# Patient Record
Sex: Female | Born: 1955 | Race: White | Hispanic: No | Marital: Single | Smoking: Never smoker
Health system: Southern US, Community
[De-identification: ages and names within clinical notes are randomized; demographics above are authoritative.]

## PROBLEM LIST (undated history)

## (undated) DIAGNOSIS — C801 Malignant (primary) neoplasm, unspecified: Secondary | ICD-10-CM

## (undated) DIAGNOSIS — H269 Unspecified cataract: Secondary | ICD-10-CM

## (undated) DIAGNOSIS — M1990 Unspecified osteoarthritis, unspecified site: Secondary | ICD-10-CM

## (undated) DIAGNOSIS — T7840XA Allergy, unspecified, initial encounter: Secondary | ICD-10-CM

## (undated) DIAGNOSIS — I10 Essential (primary) hypertension: Secondary | ICD-10-CM

## (undated) DIAGNOSIS — Z9109 Other allergy status, other than to drugs and biological substances: Secondary | ICD-10-CM

## (undated) DIAGNOSIS — F32A Depression, unspecified: Secondary | ICD-10-CM

## (undated) DIAGNOSIS — Z923 Personal history of irradiation: Secondary | ICD-10-CM

## (undated) DIAGNOSIS — K219 Gastro-esophageal reflux disease without esophagitis: Secondary | ICD-10-CM

## (undated) HISTORY — PX: OTHER SURGICAL HISTORY: SHX169

## (undated) HISTORY — DX: Malignant (primary) neoplasm, unspecified: C80.1

## (undated) HISTORY — PX: EYE SURGERY: SHX253

## (undated) HISTORY — DX: Unspecified cataract: H26.9

## (undated) HISTORY — DX: Unspecified osteoarthritis, unspecified site: M19.90

## (undated) HISTORY — DX: Gastro-esophageal reflux disease without esophagitis: K21.9

## (undated) HISTORY — DX: Allergy, unspecified, initial encounter: T78.40XA

## (undated) HISTORY — PX: BACK SURGERY: SHX140

## (undated) HISTORY — PX: ABDOMINAL HYSTERECTOMY: SHX81

## (undated) HISTORY — DX: Essential (primary) hypertension: I10

## (undated) HISTORY — PX: MANDIBLE FRACTURE SURGERY: SHX706

## (undated) HISTORY — DX: Depression, unspecified: F32.A

## (undated) HISTORY — PX: FRACTURE SURGERY: SHX138

---

## 2001-01-03 ENCOUNTER — Emergency Department (HOSPITAL_COMMUNITY): Admission: EM | Admit: 2001-01-03 | Discharge: 2001-01-03 | Payer: Self-pay | Admitting: Emergency Medicine

## 2001-01-03 ENCOUNTER — Encounter: Payer: Self-pay | Admitting: Emergency Medicine

## 2004-06-19 ENCOUNTER — Emergency Department (HOSPITAL_COMMUNITY): Admission: EM | Admit: 2004-06-19 | Discharge: 2004-06-19 | Payer: Self-pay | Admitting: Emergency Medicine

## 2005-11-12 ENCOUNTER — Emergency Department (HOSPITAL_COMMUNITY): Admission: EM | Admit: 2005-11-12 | Discharge: 2005-11-12 | Payer: Self-pay | Admitting: Emergency Medicine
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
## 2012-08-04 ENCOUNTER — Emergency Department (HOSPITAL_COMMUNITY)
Admission: EM | Admit: 2012-08-04 | Discharge: 2012-08-04 | Disposition: A | Discharge status: Home / Self Care | Payer: 59 | Attending: Emergency Medicine | Admitting: Emergency Medicine

## 2012-08-04 ENCOUNTER — Encounter (HOSPITAL_COMMUNITY): Payer: Self-pay | Admitting: Emergency Medicine

## 2012-08-04 DIAGNOSIS — I10 Essential (primary) hypertension: Secondary | ICD-10-CM

## 2012-08-04 DIAGNOSIS — R42 Dizziness and giddiness: Secondary | ICD-10-CM | POA: Insufficient documentation

## 2012-08-04 DIAGNOSIS — R040 Epistaxis: Secondary | ICD-10-CM

## 2012-08-04 HISTORY — DX: Other allergy status, other than to drugs and biological substances: Z91.09

## 2012-08-04 LAB — CBC WITH DIFFERENTIAL/PLATELET
Eosinophils Absolute: 0.1 10*3/uL (ref 0.0–0.7)
Eosinophils Relative: 2 % (ref 0–5)
Hemoglobin: 14 g/dL (ref 12.0–15.0)
Lymphocytes Relative: 18 % (ref 12–46)
MCH: 28.8 pg (ref 26.0–34.0)
MCHC: 35 g/dL (ref 30.0–36.0)
Monocytes Absolute: 0.5 10*3/uL (ref 0.1–1.0)
Monocytes Relative: 7 % (ref 3–12)
Neutro Abs: 4.8 10*3/uL (ref 1.7–7.7)
Neutrophils Relative %: 72 % (ref 43–77)

## 2012-08-04 LAB — BASIC METABOLIC PANEL
BUN: 25 mg/dL — ABNORMAL HIGH (ref 6–23)
Creatinine, Ser: 0.79 mg/dL (ref 0.50–1.10)
GFR calc Af Amer: >90 mL/min (ref >90)
Potassium: 3.5 mEq/L (ref 3.5–5.1)
Sodium: 140 mEq/L (ref 135–145)

## 2012-08-04 MED ORDER — SODIUM CHLORIDE 0.9 % IV BOLUS (SEPSIS)
500.0000 mL | Freq: Once | INTRAVENOUS | Status: AC
  Administered 2012-08-04: 500 mL via INTRAVENOUS

## 2012-08-04 MED ORDER — OXYMETAZOLINE HCL 0.05 % NA SOLN
1.0000 | Freq: Once | NASAL | Status: AC
  Administered 2012-08-04: 1 via NASAL
  Filled 2012-08-04: qty 15

## 2012-08-04 NOTE — ED Notes (Signed)
Pt given discharge paperwork.  No additional questions by pt regarding d/c.  VSS. Resps e/u.  Ambulatory on d/c.  E-signature obtained.

## 2012-08-04 NOTE — ED Notes (Signed)
Pt brought over to CDU 6 from FT approx 1230, this RN aware of pt's arrival unable to see pt or receive report upon arrival to ED

## 2012-08-04 NOTE — ED Notes (Signed)
Pt has had 3 nosebleeds over past 2 days. Does not take blood thinners. BP elevated 196/108. States her PCP told her she was "pre-hypertensive" three years ago. Has no PCP now.

## 2012-08-04 NOTE — ED Provider Notes (Signed)
History     CSN: [id number]  Arrival date & time 08/04/12  1155   First MD Initiated Contact with Patient 08/04/12 1219      Chief Complaint  Patient presents with  . Hypertension    (Consider location/radiation/quality/duration/timing/severity/associated sxs/prior treatment) HPI  Nosebleed x 2 left nares past two days.  No trauma, patient with seasonal allergies.No history of same.  PMD none.  Patient feels slightly weak but gets really lightheaded when she sees blood.  Denies chest pain, sob, no vomiting.  Some nausea after swallowing blood.  History of prolonged bleeding after surgeries.  Patient takes one aspirin 325 per day.    Past Medical History  Diagnosis Date  . Environmental allergies     Past Surgical History  Procedure Laterality Date  .  right arm surgery Right   . Mandible fracture surgery    . Back surgery      No family history on file.  History  Substance Use Topics  . Smoking status: Never Smoker   . Smokeless tobacco: Not on file  . Alcohol Use: No    OB History   Grav Para Term Preterm Abortions TAB SAB Ect Mult Living                  Review of Systems  All other systems reviewed and are negative.    Allergies  Codeine  Home Medications  No current outpatient prescriptions on file. Allegra otc BP 196/96  Pulse 96  Temp(Src) 97.8 F (36.6 C) (Oral)  Resp 18  SpO2 99%  Physical Exam  Nursing note and vitals reviewed. Constitutional: She is oriented to person, place, and time. She appears well-developed and well-nourished.  HENT:  Head: Normocephalic and atraumatic.  Right Ear: External ear normal.  Left Ear: External ear normal.  Mouth/Throat: Oropharynx is clear and moist.  Dried blood bilateral nares.    Eyes: Conjunctivae and EOM are normal. Pupils are equal, round, and reactive to light.  Neck: Normal range of motion. Neck supple.  Cardiovascular: Normal rate, regular rhythm, normal heart sounds and intact distal  pulses.   Pulmonary/Chest: Effort normal and breath sounds normal.  Abdominal: Soft. Bowel sounds are normal.  Musculoskeletal: Normal range of motion. She exhibits no edema.  Neurological: She is alert and oriented to person, place, and time. She has normal reflexes.  Skin: Skin is warm and dry. No rash noted. No erythema. No pallor.  Psychiatric: She has a normal mood and affect. Her behavior is normal. Judgment and thought content normal.    ED Course  Procedures (including critical care time)  Labs Reviewed - No data to display No results found.   No diagnosis found.   Results for orders placed during the hospital encounter of 08/04/12  CBC WITH DIFFERENTIAL      Result Value Range   WBC 6.6  4.0 - 10.5 K/uL   RBC 4.86  3.87 - 5.11 MIL/uL   Hemoglobin 14.0  12.0 - 15.0 g/dL   HCT 78.2  95.6 - 21.3 %   MCV 82.3  78.0 - 100.0 fL   MCH 28.8  26.0 - 34.0 pg   MCHC 35.0  30.0 - 36.0 g/dL   RDW 08.6  57.8 - 46.9 %   Platelets 257  150 - 400 K/uL   Neutrophils Relative 72  43 - 77 %   Neutro Abs 4.8  1.7 - 7.7 K/uL   Lymphocytes Relative 18  12 - 46 %  Lymphs Abs 1.2  0.7 - 4.0 K/uL   Monocytes Relative 7  3 - 12 %   Monocytes Absolute 0.5  0.1 - 1.0 K/uL   Eosinophils Relative 2  0 - 5 %   Eosinophils Absolute 0.1  0.0 - 0.7 K/uL   Basophils Relative 0  0 - 1 %   Basophils Absolute 0.0  0.0 - 0.1 K/uL  BASIC METABOLIC PANEL      Result Value Range   Sodium 140  135 - 145 mEq/L   Potassium 3.5  3.5 - 5.1 mEq/L   Chloride 105  96 - 112 mEq/L   CO2 25  19 - 32 mEq/L   Glucose, Bld 87  70 - 99 mg/dL   BUN 25 (*) 6 - 23 mg/dL   Creatinine, Ser 1.61  0.50 - 1.10 mg/dL   Calcium 9.8  8.4 - 09.6 mg/dL   GFR calc non Af Amer >90  >90 mL/min   GFR calc Af Amer >90  >90 mL/min    Date: 08/04/2012  Rate: 95  Rhythm: normal sinus rhythm  QRS Axis: normal  Intervals: normal  ST/T Wave abnormalities: nonspecific ST/T changes  Conduction Disutrbances:none  Narrative  Interpretation:   Old EKG Reviewed: none available  Filed Vitals:   08/04/12 1445  BP: 165/102  Pulse: 76  Temp:   Resp: 19    Patient has remained stable here on the monitor. Her last blood pressure was 160/90. I discussed starting antihypertensives with her. She does not primary care Dr. but has made an appointment with Carney Bern here in the emergency department. She has not had any repeat of her nosebleed. She tells me that she has had ongoing white coat hypertension. She works at [employer] and has access to nurses daily. The plan currently is that she will have her blood pressure checked at work twice a day for the next week before she sees her new primary care Dr. Doreatha Lew take these to her new primary care doctor and they will make a joint decision as to whether or not she needs to be on antihypertensives. I do not think that her nosebleed secondary to her hypertension. Her nose bleeding has resolved and has not recurred. We have discussed nosebleed treatment at home in the future. Her hemoglobin is stable here at 14.    Hilario Quarry, MD 08/04/12 1539

## 2012-08-04 NOTE — ED Notes (Signed)
Pt reports a nose bleed x2 this week, first one on Tuesday was heavier than today. Pt reports she just recently returned from [place] Sunday and immediately felt her allergies to flare up, her nose swelling, and/or unable to breathe through her nose. Pt states she blew her nose today releasing a blood clot and that's when the uncontrollable bleeding started. Pt was able to control the bleeding Tuesday w/pressure and ice.  Airway is intact, o2 sats 985 ra Pt reports she is feeling fine and ready to go home. Pt has an appt Tuesday with [facility] to evaluate her BP

## 2012-08-17 DIAGNOSIS — R040 Epistaxis: Secondary | ICD-10-CM | POA: Insufficient documentation

## 2012-08-17 DIAGNOSIS — I10 Essential (primary) hypertension: Secondary | ICD-10-CM | POA: Insufficient documentation

## 2012-08-24 DIAGNOSIS — J309 Allergic rhinitis, unspecified: Secondary | ICD-10-CM | POA: Insufficient documentation

## 2016-10-01 DIAGNOSIS — H4053X1 Glaucoma secondary to other eye disorders, bilateral, mild stage: Secondary | ICD-10-CM | POA: Insufficient documentation

## 2018-12-08 ENCOUNTER — Telehealth: Payer: Self-pay | Admitting: General Practice

## 2018-12-08 NOTE — Telephone Encounter (Signed)
Made in error

## 2018-12-09 ENCOUNTER — Ambulatory Visit: Payer: 59 | Admitting: Family Medicine

## 2019-01-30 ENCOUNTER — Ambulatory Visit (INDEPENDENT_AMBULATORY_CARE_PROVIDER_SITE_OTHER): Payer: 59 | Admitting: Family Medicine

## 2019-01-30 ENCOUNTER — Other Ambulatory Visit: Payer: Self-pay | Admitting: *Deleted

## 2019-01-30 ENCOUNTER — Encounter: Payer: Self-pay | Admitting: *Deleted

## 2019-01-30 ENCOUNTER — Encounter: Payer: Self-pay | Admitting: Family Medicine

## 2019-01-30 VITALS — Temp 98.2°F | Resp 16 | Ht 64.0 in | Wt 220.0 lb

## 2019-01-30 DIAGNOSIS — Z1211 Encounter for screening for malignant neoplasm of colon: Secondary | ICD-10-CM

## 2019-01-30 DIAGNOSIS — Z1212 Encounter for screening for malignant neoplasm of rectum: Secondary | ICD-10-CM | POA: Diagnosis not present

## 2019-01-30 DIAGNOSIS — Z1231 Encounter for screening mammogram for malignant neoplasm of breast: Secondary | ICD-10-CM

## 2019-01-30 DIAGNOSIS — I10 Essential (primary) hypertension: Secondary | ICD-10-CM

## 2019-01-30 DIAGNOSIS — E2839 Other primary ovarian failure: Secondary | ICD-10-CM | POA: Diagnosis not present

## 2019-01-30 NOTE — Progress Notes (Signed)
Virtual Visit via Video Note  Subjective  CC:  Chief Complaint  Patient presents with  . Establish Care  . Hypertension     I connected with Haley Davis on 01/30/19 at  1:20 PM EDT by a video enabled telemedicine application and verified that I am speaking with the correct person using two identifiers. Location patient: Home Location provider: [facility] [place], Office Persons participating in the virtual visit: Haley Davis, Haley Arnt, MD Lilli Light, [place] discussed the limitations of evaluation and management by telemedicine and the availability of in person appointments. The patient expressed understanding and agreed to proceed. HPI: Haley Davis is a 63 y.o. female who was contacted today to address the problems listed above in the chief complaint. She is reestablishing care with. I reviewed records from [place]; last cpe 10/2017 with labwork at that time . HTN: She reports good control.  She follows her blood pressure at home.  She is on a beta-blocker, ARB/HCTZ combo.  She has been on this for a number of years.  No adverse effects.  She denies chest pain, shortness of breath or lower extremity edema.  She is overdue for blood work.  No history of kidney disease.  No history of hyperlipidemia. Marland Kitchen Health maintenance: Due for physical.  Breast cancer screening: She is never had a mammogram.  Pap smear due, last was in 2014 and normal.  Colon cancer screening, never has had this done and defers colonoscopy.  She lives alone and would be hard pressed to get a ride.  She is average risk.  She gets her flu shot at work. . Social history: No longer working for [employer], instead working for:.  Working for [employer] and.  Doing well.  Non-smoker, nondrinker.  No other changes. . Family history: No new medical problems noted. Assessment  1. HTN (hypertension), benign   2. Encounter for screening mammogram for breast cancer   3. Screening for colorectal  cancer   4. Hypoestrogenism      Plan   Hypertension: This medical condition is well controlled. There are no signs of complications, medication side effects, or red flags. Patient is instructed to continue the current treatment plan without change in therapies or medications..  Continue current medications.  Recommend follow-up in the next 1 to 3 months for complete physical with blood work to ensure kidney function electrolytes and cholesterol are stable.  Health maintenance: Counseling done.  Recommend mammogram, patient finally agrees.  Ordered with bone density for screening.  Ordered Cologuard testing for colon cancer screening.  She will return for complete physical with lab work. I discussed the assessment and treatment plan with the patient. The patient was provided an opportunity to ask questions and all were answered. The patient agreed with the plan and demonstrated an understanding of the instructions.   The patient was advised to call back or seek an in-person evaluation if the symptoms worsen or if the condition fails to improve as anticipated. Follow up: 1 to 3 months for complete physical with lab work Visit date not found  No orders of the defined types were placed in this encounter.     I reviewed the patients updated PMH, FH, and SocHx.    Patient Active Problem List   Diagnosis Date Noted  . HTN (hypertension), benign 08/17/2012    Priority: High  . Morbid obesity due to excess calories ([place]) 08/17/2012  Priority: High  . Angle-closure glaucoma, secondary, bilateral, mild stage 10/01/2016    Priority: Medium  . Epistaxis 08/17/2012    Priority: Medium  . Allergic rhinitis 08/24/2012    Priority: Low   Current Meds  Medication Sig  . aspirin EC 325 MG tablet Take 325 mg by mouth daily.  . bifidobacterium infantis (ALIGN) capsule Take by mouth.  . Cholecalciferol (VITAMIN D-3 PO) Take 2,000 Units by mouth daily.  . metoprolol tartrate (LOPRESSOR) 50 MG  tablet Take 50 mg by mouth 2 (two) times daily.  . Multiple Vitamin (MULTIVITAMIN WITH MINERALS) TABS Take 1 tablet by mouth daily. NO IRON  . olmesartan-hydrochlorothiazide (BENICAR HCT) 20-12.5 MG tablet Take 1 tablet by mouth daily.    Allergies: Patient is allergic to codeine. Family History: Patient family history includes Acute myelogenous leukemia in her father; Hyperlipidemia in her mother; Hypertension in her mother; Stroke in her maternal grandfather. Social History:  Patient  reports that she has never smoked. She has never used smokeless tobacco. She reports that she does not drink alcohol or use drugs.  Review of Systems: Constitutional: Negative for fever malaise or anorexia Cardiovascular: negative for chest pain Respiratory: negative for SOB or persistent cough Gastrointestinal: negative for abdominal pain  OBJECTIVE Vitals: Temp 98.2 F (36.8 C) (Oral)   Resp 16   Ht 5\' 4"  (1.626 m)   Wt 220 lb (99.8 kg)   BMI 37.76 kg/m  General: no acute distress , A&Ox3  Haley Arnt, MD

## 2019-04-03 MED FILL — OLMESARTAN-HCTZ 20-12.5 MG: 20-12.5 | 90 days supply | Qty: 90 | Fill #0

## 2021-08-12 DIAGNOSIS — Z6825 Body mass index (BMI) 25.0-25.9, adult: Secondary | ICD-10-CM | POA: Insufficient documentation

## 2021-08-12 DIAGNOSIS — E669 Obesity, unspecified: Secondary | ICD-10-CM | POA: Insufficient documentation

## 2021-09-17 ENCOUNTER — Telehealth: Payer: Self-pay | Admitting: Radiation Oncology

## 2021-09-17 NOTE — Telephone Encounter (Signed)
LVM to schedule consult with Dr. Kinard °

## 2021-09-30 NOTE — Progress Notes (Incomplete)
YN Location of Tumor / Histology: Endometrial  Haley Davis presented with symptoms of: post menopausal bleeding  Biopsies revealed:   Final Diagnosis   [facility]  Sentinel lymph nodes, right pelvic region, excision:              Three (3) lymph nodes are negative for malignancy.              Confirmed by pankeratin stains.   2.   Sentinel lymph node, left pelvic region, excision:              One (1) lymph node is negative for malignancy.              Confirmed by pankeratin stain.   3.   Uterus with cervix, bilateral ovaries and fallopian tubes; hysterectomy and bilateral salpingo-oophorectomy:              Endometrial adenocarcinoma, FIGO grade 2.              Tumor measures at least 8 cm in greatest dimension.              Myometrial invasion is less than 50%.              Benign cervix with nabothian cysts and endocervical polyp.              Intramural leiomyomata of myometrium.              Degenerative changes of leiomyoma including calcification.              Benign bilateral ovaries and fallopian tubes.              Pathologic stage:  pT1a pN0              See synoptic report.    Past/Anticipated interventions by Gyn/Onc surgery, if any: ROBOTIC HYSTERECTOMY (UTERUS GREATER THAN 250) WITH BILATERAL SALPINGO-OOPHORECTOMY, SENTINEL LYMPH NODE MAPPING AND DISSECTION, AND MINI LAPAROTOMY FOR SPECIMEN RETRIEVAL (Bilateral) on 07/25/2021 by Dr. Polly Cobia  Past/Anticipated interventions by medical oncology, if any: no  Weight changes, if any: {:18581}  Bowel/Bladder complaints, if any: {yes no:314532}, {Blank single:19197::"diarrhea","constipation","urinary frequency","burning","trouble emptying bladder"," "}  Nausea/Vomiting, if any: {:18581}  Pain issues, if any:  {:18581}  SAFETY ISSUES: Prior radiation? {:18581} Pacemaker/ICD? {:18581} Possible current pregnancy? No hysterectomy Is the patient on methotrexate? {:18581}  Current Complaints / other  details:  ***

## 2021-09-30 NOTE — Progress Notes (Signed)
Radiation Oncology         [phone] ________________________________  Initial Outpatient Consultation  Name: Haley Davis MRN: [id number]  Date: 10/01/2021  DOB: Feb 25, 1956  CC:Leamon Arnt, MD  Hart Rochester, MD   REFERRING PHYSICIAN: Hart Rochester, MD  DIAGNOSIS: There were no encounter diagnoses.  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 female who is accompanied by ***. she is seen as a courtesy of Dr. Polly Cobia for an opinion concerning radiation therapy as part of management for her recently diagnosed endometrial cancer.   The patient presented to her gastroenterologist at [place] this past February with abdominal discomfort and pain. CT of the abdomen and pelvis on 06/05/21 demonstrated uterine fibroids and distention of endometrial cavity up to 3cm in thickness.   Accordingly, the patient met with her OB, Dr. Tamala Julian, who collected endometrial biopsies on 06/19/21 which revealed fragments of atypical epithelium, suspicious for endometrial neoplasia. The patient also reported postmenopausal bleeding during this visit.  The patient was referred to Dr. Polly Cobia and proceeded to undergo hysterectomy, BSO, nodal biopsies, and mini laparotomy on 07/25/21. 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 All sentinel nodes were negative and bilateral ovaries and fallopian tubes showed benign findings. ER 80% positive with weak staining intensity and PR 5% positive with weak staining intensity.   Given the patient's age, grade, and depth of invasion, Dr. Polly Cobia referred the patient to me today for consideration of vaginal brachytherapy.    PREVIOUS RADIATION THERAPY: No  PAST MEDICAL HISTORY:  Past Medical History:  Diagnosis Date   Environmental allergies    Hypertension     PAST  SURGICAL HISTORY: Past Surgical History:  Procedure Laterality Date    right arm surgery Right    BACK SURGERY     MANDIBLE FRACTURE SURGERY      FAMILY HISTORY:  Family History  Problem Relation Age of Onset   Hyperlipidemia Mother    Hypertension Mother    Acute myelogenous leukemia Father    Stroke Maternal Grandfather     SOCIAL HISTORY:  Social History   Tobacco Use   Smoking status: Never   Smokeless tobacco: Never  Vaping Use   Vaping Use: Never used  Substance Use Topics   Alcohol use: No   Drug use: No    ALLERGIES:  Allergies  Allergen Reactions   Codeine Anaphylaxis    MEDICATIONS:  Current Outpatient Medications  Medication Sig Dispense Refill   aspirin EC 325 MG tablet Take 325 mg by mouth daily.     bifidobacterium infantis (ALIGN) capsule Take by mouth.     Cholecalciferol (VITAMIN D-3 PO) Take 2,000 Units by mouth daily.     metoprolol tartrate (LOPRESSOR) 50 MG tablet Take 50 mg by mouth 2 (two) times daily.     Multiple Vitamin (MULTIVITAMIN WITH MINERALS) TABS Take 1 tablet by mouth daily. NO IRON     olmesartan-hydrochlorothiazide (BENICAR HCT) 20-12.5 MG tablet Take 1 tablet by mouth daily.     No current facility-administered medications for this encounter.    REVIEW OF SYSTEMS:  A 10+ POINT REVIEW OF SYSTEMS WAS OBTAINED including neurology, dermatology, psychiatry, cardiac, respiratory, lymph, extremities, GI, GU, musculoskeletal, constitutional, reproductive, HEENT. ***   PHYSICAL EXAM:  vitals were not taken for this visit.   General: Alert and oriented,  in no acute distress HEENT: Head is normocephalic. Extraocular movements are intact. Oropharynx is clear. Neck: Neck is supple, no palpable cervical or supraclavicular lymphadenopathy. Heart: Regular in rate and rhythm with no murmurs, rubs, or gallops. Chest: Clear to auscultation bilaterally, with no rhonchi, wheezes, or rales. Abdomen: Soft, nontender, nondistended, with no  rigidity or guarding. Extremities: No cyanosis or edema. Lymphatics: see Neck Exam Skin: No concerning lesions. Musculoskeletal: symmetric strength and muscle tone throughout. Neurologic: Cranial nerves II through XII are grossly intact. No obvious focalities. Speech is fluent. Coordination is intact. Psychiatric: Judgment and insight are intact. Affect is appropriate.  On pelvic examination the external genitalia were unremarkable. A speculum exam was performed. There are no mucosal lesions noted in the vaginal vault. A Pap smear was obtained of the proximal vagina. On bimanual and rectovaginal examination there were no pelvic masses appreciated. ***   ECOG = ***  0 - Asymptomatic (Fully active, able to carry on all predisease activities without restriction)  1 - Symptomatic but completely ambulatory (Restricted in physically strenuous activity but ambulatory and able to carry out work of a light or sedentary nature. For example, light housework, office work)  2 - Symptomatic, <50% in bed during the day (Ambulatory and capable of all self care but unable to carry out any work activities. Up and about more than 50% of waking hours)  3 - Symptomatic, >50% in bed, but not bedbound (Capable of only limited self-care, confined to bed or chair 50% or more of waking hours)  4 - Bedbound (Completely disabled. Cannot carry on any self-care. Totally confined to bed or chair)  5 - Death   Eustace Pen MM, Creech RH, Tormey DC, et al. [phone]). "Toxicity and response criteria of the [facility]". [place] Oncol. 5 (6): 649-55  LABORATORY DATA:  Lab Results  Component Value Date   WBC 6.6 08/04/2012   HGB 14.0 08/04/2012   HCT 40.0 08/04/2012   MCV 82.3 08/04/2012   PLT 257 08/04/2012   NEUTROABS 4.8 08/04/2012   Lab Results  Component Value Date   NA 140 08/04/2012   K 3.5 08/04/2012   CL 105 08/04/2012   CO2 25 08/04/2012   GLUCOSE 87 08/04/2012   CREATININE 0.79  08/04/2012   CALCIUM 9.8 08/04/2012      RADIOGRAPHY: No results found.    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  We discussed the natural history of *** and general treatment, highlighting the role of radiotherapy in the management.  We discussed the available radiation techniques, and focused on the details of logistics and delivery.  We reviewed the anticipated acute and late sequelae associated with radiation in this setting.  The patient was encouraged to ask questions that I answered to the best of my ability. *** A patient consent form was discussed and signed.  We retained a copy for our records.  The patient would like to proceed with radiation and will be scheduled for CT simulation.  PLAN: ***    *** minutes of total time was spent for this patient encounter, including preparation, face-to-face counseling with the patient and coordination of care, physical exam, and documentation of the encounter.   ------------------------------------------------  Blair Promise, PhD, MD  This document serves as a record of services personally performed by Gery Pray, MD. It was created on his  behalf by Roney Mans, a trained medical scribe. The creation of this record is based on the scribe's personal observations and the provider's statements to them. This document has been checked and approved by the attending provider.

## 2021-10-01 ENCOUNTER — Other Ambulatory Visit: Payer: Self-pay

## 2021-10-01 ENCOUNTER — Encounter: Payer: Self-pay | Admitting: Radiation Oncology

## 2021-10-01 ENCOUNTER — Ambulatory Visit
Admission: RE | Admit: 2021-10-01 | Discharge: 2021-10-01 | Disposition: A | Discharge status: Home / Self Care | Payer: Medicare (Managed Care) | Source: Ambulatory Visit | Attending: Radiation Oncology | Admitting: Radiation Oncology

## 2021-10-01 VITALS — BP 170/106 | HR 84 | Temp 97.5°F | Resp 20 | Ht 64.0 in | Wt 154.4 lb

## 2021-10-01 DIAGNOSIS — Z8041 Family history of malignant neoplasm of ovary: Secondary | ICD-10-CM | POA: Insufficient documentation

## 2021-10-01 DIAGNOSIS — Z8542 Personal history of malignant neoplasm of other parts of uterus: Secondary | ICD-10-CM | POA: Insufficient documentation

## 2021-10-01 DIAGNOSIS — C541 Malignant neoplasm of endometrium: Secondary | ICD-10-CM | POA: Diagnosis not present

## 2021-10-01 DIAGNOSIS — Z7982 Long term (current) use of aspirin: Secondary | ICD-10-CM | POA: Diagnosis not present

## 2021-10-01 DIAGNOSIS — I10 Essential (primary) hypertension: Secondary | ICD-10-CM | POA: Diagnosis not present

## 2021-10-01 DIAGNOSIS — Z809 Family history of malignant neoplasm, unspecified: Secondary | ICD-10-CM | POA: Insufficient documentation

## 2021-10-02 ENCOUNTER — Telehealth: Payer: Self-pay | Admitting: *Deleted

## 2021-10-02 NOTE — Telephone Encounter (Signed)
CALLED PATIENT TO INFORM OF NEW HDR [facility] , NO ANSWER, MAILED APPT. CARD

## 2021-10-07 ENCOUNTER — Telehealth: Payer: Self-pay | Admitting: *Deleted

## 2021-10-07 NOTE — Telephone Encounter (Signed)
CALLED PATIENT TO REMIND OF NEW HDR [facility] FOR 10-09-21, SPOKE WITH PATIENT AND SHE IS AWARE OF THESE APPTS.

## 2021-10-08 NOTE — Progress Notes (Signed)
  Radiation Oncology         [phone] ________________________________  Name: Haley Davis MRN: [id number]  Date: 10/09/2021  DOB: [birth year]  CC: Leamon Arnt, MD  Hart Rochester, MD  HDR BRACHYTHERAPY NOTE  DIAGNOSIS: The encounter diagnosis was Endometrial cancer [facility]).   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 She will be treated with a 2.5 cm diameter segmented cylinder. This conforms to her anatomy without undue discomfort.  Vaginal brachytherapy procedure node: The patient was brought to the [place] suite. Identity was confirmed. All relevant records and images related to the planned course of therapy were reviewed. The patient freely provided informed written consent to proceed with treatment after reviewing the details related to the planned course of therapy. The consent form was witnessed and verified by the simulation staff. Then, the patient was set-up in a stable reproducible supine position for radiation therapy. Pelvic exam revealed the vaginal cuff to be intact . The patient's custom vaginal cylinder was placed in the proximal vagina. This was affixed to the CT/MR stabilization plate to prevent slippage. Patient tolerated the placement well.  Verification simulation note:  A fiducial marker was placed within the vaginal cylinder. An AP and lateral film was then obtained through the pelvis area. This documented accurate position of the vaginal cylinder for treatment.  HDR BRACHYTHERAPY TREATMENT  The remote afterloading device was affixed to the vaginal cylinder by catheter. Patient then proceeded to undergo her first high-dose-rate treatment directed at the proximal vagina. The patient was prescribed a dose of 6.0 gray to be delivered to the mucosal surface. Treatment length was 3.0 cm. Patient was treated with 1 channel using 7 dwell positions. Treatment  time was 244.4 seconds. Iridium 192 was the high-dose-rate source for treatment. The patient tolerated the treatment well. After completion of her therapy, a radiation survey was performed documenting return of the iridium source into the GammaMed safe.   PLAN: The patient will return next week for her second high-dose-rate treatment.  ________________________________  -----------------------------------  Blair Promise, PhD, MD  This document serves as a record of services personally performed by Gery Pray, MD. It was created on his behalf by Roney Mans, a trained medical scribe. The creation of this record is based on the scribe's personal observations and the provider's statements to them. This document has been checked and approved by the attending provider.

## 2021-10-08 NOTE — Progress Notes (Signed)
  Radiation Oncology         [phone] ________________________________  Name: Haley Davis MRN: [id number]  Date: 10/09/2021  DOB: 1956-01-27  Vaginal Brachytherapy Procedure Note  CC: Leamon Arnt, MD Hart Rochester, MD    ICD-10-CM   1. Endometrial cancer (HCC)  C54.1       Diagnosis: The encounter diagnosis was Endometrial cancer ([place]).   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  She denies any new medical issues since her consultation appointment.  She has started exercising at [facility] well and is excited about this.  She wishes to keep her weight off.  I encouraged her to exercise as tolerated.    ALLERGIES: is allergic to codeine and wellbutrin [bupropion].  Meds: Current Outpatient Medications  Medication Sig Dispense Refill   acetaminophen (TYLENOL) 500 MG tablet Take by mouth.     bifidobacterium infantis (ALIGN) capsule Take by mouth.     Cholecalciferol (VITAMIN D-3 PO) Take 2,000 Units by mouth daily.     Multiple Vitamin (MULTIVITAMIN WITH MINERALS) TABS Take 1 tablet by mouth daily. NO IRON     aspirin EC 325 MG tablet Take 325 mg by mouth daily. (Patient not taking: Reported on 10/09/2021)     No current facility-administered medications for this encounter.    Physical Findings: The patient is in no acute distress. Patient is alert and oriented.  height is '5\' 4"'$  (1.626 m) and weight is 155 lb 2 oz (70.4 kg). Her temporal temperature is 97.1 F (36.2 C) (abnormal). Her blood pressure is 147/90 (abnormal) and her pulse is 104 (abnormal). Her respiration is 18 and oxygen saturation is 100%.   No palpable cervical, supraclavicular or axillary lymphoadenopathy. The heart has a regular rate and rhythm. The lungs are clear to auscultation. Abdomen soft and non-tender.  On pelvic examination the external genitalia were unremarkable. A speculum exam was performed. Vaginal  cuff intact, no mucosal lesions. On bimanual exam there were no pelvic masses appreciated.   Lab Findings: Lab Results  Component Value Date   WBC 6.6 08/04/2012   HGB 14.0 08/04/2012   HCT 40.0 08/04/2012   MCV 82.3 08/04/2012   PLT 257 08/04/2012    Radiographic Findings: No results found.  Impression: The encounter diagnosis was Endometrial cancer ([place]).   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 The patient will be treated with a 2.5 cm diameter cylinder with a treatment length of 3.0 cm. This distended the vaginal vault without undue discomfort. The patient tolerated the procedure well.  The patient was successfully fitted for a vaginal cylinder. The patient is appropriate to begin vaginal brachytherapy.   Plan: The patient will proceed with CT simulation and vaginal brachytherapy today.    _______________________________   Blair Promise, PhD, MD  This document serves as a record of services personally performed by Gery Pray, MD. It was created on his behalf by Roney Mans, a trained medical scribe. The creation of this record is based on the scribe's personal observations and the provider's statements to them. This document has been checked and approved by the attending provider.

## 2021-10-09 ENCOUNTER — Ambulatory Visit
Admission: RE | Admit: 2021-10-09 | Discharge: 2021-10-09 | Disposition: A | Discharge status: Home / Self Care | Payer: Medicare (Managed Care) | Source: Ambulatory Visit | Attending: Radiation Oncology | Admitting: Radiation Oncology

## 2021-10-09 ENCOUNTER — Other Ambulatory Visit: Payer: Self-pay

## 2021-10-09 DIAGNOSIS — C541 Malignant neoplasm of endometrium: Secondary | ICD-10-CM

## 2021-10-09 DIAGNOSIS — Z7982 Long term (current) use of aspirin: Secondary | ICD-10-CM | POA: Diagnosis not present

## 2021-10-09 DIAGNOSIS — Z79899 Other long term (current) drug therapy: Secondary | ICD-10-CM | POA: Insufficient documentation

## 2021-10-09 LAB — RAD ONC ARIA SESSION SUMMARY
Course Elapsed Days: 0
Plan Fractions Treated to Date: 1
Plan Prescribed Dose Per Fraction: 6 Gy
Plan Total Fractions Prescribed: 5
Plan Total Prescribed Dose: 30 Gy
Reference Point Dosage Given to Date: 6 Gy
Reference Point Session Dosage Given: 6 Gy
Session Number: 1

## 2021-10-09 NOTE — Progress Notes (Addendum)
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              Confirmed by pankeratin stains.   2.   Sentinel lymph node, left pelvic region, excision:              One (1) lymph node is negative for malignancy.              Confirmed by pankeratin stain.   3.   Uterus with cervix, bilateral ovaries and fallopian tubes; hysterectomy and bilateral salpingo-oophorectomy:              Endometrial adenocarcinoma, FIGO grade 2.              Tumor measures at least 8 cm in greatest dimension.              Myometrial invasion is less than 50%.              Benign cervix with nabothian cysts and endocervical polyp.              Intramural leiomyomata of myometrium.              Degenerative changes of leiomyoma including calcification.              Benign bilateral ovaries and fallopian tubes.              Pathologic stage:  pT1a pN0              See synoptic report.      Past/Anticipated interventions by Gyn/Onc surgery, if any: ROBOTIC HYSTERECTOMY (UTERUS GREATER THAN 250) WITH BILATERAL SALPINGO-OOPHORECTOMY, SENTINEL LYMPH NODE MAPPING AND DISSECTION, AND MINI LAPAROTOMY FOR SPECIMEN RETRIEVAL (Bilateral) on 07/25/2021 by Dr. Polly Cobia   Past/Anticipated interventions by medical oncology, if any: no   Weight changes, if any: yes lost about 100 lbs    Bowel/Bladder complaints, if any: Does have issues with gas. Discussed trying Gas-X and probiotics.   Nausea/Vomiting, if any: no   Pain issues, if any:  no   SAFETY ISSUES: Prior radiation? no Pacemaker/ICD? no Possible current pregnancy? No hysterectomy Is the patient on methotrexate? no   Current Complaints / other details:   BP (!)  147/90 (BP Location: Left Arm, Patient Position: Sitting)   Pulse (!) 104   Temp (!) 97.1 F (36.2 C) (Temporal)   Resp 18   Ht '5\' 4"'$  (1.626 m)   Wt 155 lb 2 oz (70.4 kg)   SpO2 100%   BMI 26.63 kg/m

## 2021-10-15 ENCOUNTER — Telehealth: Payer: Self-pay | Admitting: *Deleted

## 2021-10-15 NOTE — Progress Notes (Signed)
  Radiation Oncology         [phone] ________________________________  Name: Haley Davis MRN: [id number]  Date: 10/16/2021  DOB: 12/29/1955  CC: Leamon Arnt, MD  Leamon Arnt, MD  HDR BRACHYTHERAPY NOTE  DIAGNOSIS: The encounter diagnosis was Endometrial cancer [facility]).   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 She will be treated with a 2.5 cm diameter segmented cylinder. This conforms to her anatomy without undue discomfort.  Vaginal brachytherapy procedure node: The patient was brought to the [place] suite. Identity was confirmed. All relevant records and images related to the planned course of therapy were reviewed. The patient freely provided informed written consent to proceed with treatment after reviewing the details related to the planned course of therapy. The consent form was witnessed and verified by the simulation staff. Then, the patient was set-up in a stable reproducible supine position for radiation therapy. Pelvic exam revealed the vaginal cuff to be intact . The patient's custom vaginal cylinder was placed in the proximal vagina. This was affixed to the CT/MR stabilization plate to prevent slippage. Patient tolerated the placement well.  Verification simulation note:  A fiducial marker was placed within the vaginal cylinder. An AP and lateral film was then obtained through the pelvis area. This documented accurate position of the vaginal cylinder for treatment.  HDR BRACHYTHERAPY TREATMENT  The remote afterloading device was affixed to the vaginal cylinder by catheter. Patient then proceeded to undergo her second high-dose-rate treatment directed at the proximal vagina. The patient was prescribed a dose of 6.0 gray to be delivered to the mucosal surface. Treatment length was 3.0 cm. Patient was treated with 1 channel using 7 dwell positions. Treatment time  was 261.0 seconds. Iridium 192 was the high-dose-rate source for treatment. The patient tolerated the treatment well. After completion of her therapy, a radiation survey was performed documenting return of the iridium source into the GammaMed safe.   PLAN: The patient will return next week for her third high-dose-rate treatment.  ________________________________  -----------------------------------  Blair Promise, PhD, MD  This document serves as a record of services personally performed by Gery Pray, MD. It was created on his behalf by Roney Mans, a trained medical scribe. The creation of this record is based on the scribe's personal observations and the provider's statements to them. This document has been checked and approved by the attending provider.

## 2021-10-15 NOTE — Telephone Encounter (Signed)
CALLED PATIENT TO REMIND OF HDR [place] 10-16-21 @ 2 PM, SPOKE WITH PATIENT AND SHE IS AWARE OF THIS [place].

## 2021-10-16 ENCOUNTER — Other Ambulatory Visit: Payer: Self-pay

## 2021-10-16 ENCOUNTER — Ambulatory Visit
Admission: RE | Admit: 2021-10-16 | Discharge: 2021-10-16 | Disposition: A | Discharge status: Home / Self Care | Payer: Medicare (Managed Care) | Source: Ambulatory Visit | Attending: Radiation Oncology | Admitting: Radiation Oncology

## 2021-10-16 DIAGNOSIS — C541 Malignant neoplasm of endometrium: Secondary | ICD-10-CM | POA: Diagnosis not present

## 2021-10-16 LAB — RAD ONC ARIA SESSION SUMMARY
Course Elapsed Days: 7
Plan Fractions Treated to Date: 2
Plan Prescribed Dose Per Fraction: 6 Gy
Plan Total Fractions Prescribed: 5
Plan Total Prescribed Dose: 30 Gy
Reference Point Dosage Given to Date: 12 Gy
Reference Point Session Dosage Given: 6 Gy
Session Number: 2

## 2021-10-21 ENCOUNTER — Telehealth: Payer: Self-pay | Admitting: *Deleted

## 2021-10-21 NOTE — Telephone Encounter (Signed)
CALLED PATIENT TO REMIND OF HDR [place]. FOR 10-22-21 @  2 PM, SPOKE WITH PATIENT AND SHE IS AWARE OF THIS TX.

## 2021-10-22 ENCOUNTER — Other Ambulatory Visit: Payer: Self-pay

## 2021-10-22 ENCOUNTER — Ambulatory Visit
Admission: RE | Admit: 2021-10-22 | Discharge: 2021-10-22 | Disposition: A | Discharge status: Home / Self Care | Payer: Medicare (Managed Care) | Source: Ambulatory Visit | Attending: Radiation Oncology | Admitting: Radiation Oncology

## 2021-10-22 DIAGNOSIS — C541 Malignant neoplasm of endometrium: Secondary | ICD-10-CM

## 2021-10-22 LAB — RAD ONC ARIA SESSION SUMMARY
Course Elapsed Days: 13
Plan Fractions Treated to Date: 3
Plan Prescribed Dose Per Fraction: 6 Gy
Plan Total Fractions Prescribed: 5
Plan Total Prescribed Dose: 30 Gy
Reference Point Dosage Given to Date: 18 Gy
Reference Point Session Dosage Given: 6 Gy
Session Number: 3

## 2021-10-22 NOTE — Progress Notes (Signed)
  Radiation Oncology         [phone] ________________________________  Name: Haley Davis MRN: [id number]  Date: 10/22/2021  DOB: [birth year]  CC: Leamon Arnt, MD  Leamon Arnt, MD  HDR BRACHYTHERAPY NOTE  DIAGNOSIS: The encounter diagnosis was Endometrial cancer [facility]).   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 She will be treated with a 2.5 cm diameter segmented cylinder. This conforms to her anatomy without undue discomfort.  Vaginal brachytherapy procedure node: The patient was brought to the [place] suite. Identity was confirmed. All relevant records and images related to the planned course of therapy were reviewed. The patient freely provided informed written consent to proceed with treatment after reviewing the details related to the planned course of therapy. The consent form was witnessed and verified by the simulation staff. Then, the patient was set-up in a stable reproducible supine position for radiation therapy. Pelvic exam revealed the vaginal cuff to be intact . The patient's custom vaginal cylinder was placed in the proximal vagina. This was affixed to the CT/MR stabilization plate to prevent slippage. Patient tolerated the placement well.  Verification simulation note:  A fiducial marker was placed within the vaginal cylinder. An AP and lateral film was then obtained through the pelvis area. This documented accurate position of the vaginal cylinder for treatment.  HDR BRACHYTHERAPY TREATMENT  The remote afterloading device was affixed to the vaginal cylinder by catheter. Patient then proceeded to undergo her third high-dose-rate treatment directed at the proximal vagina. The patient was prescribed a dose of 6.0 gray to be delivered to the mucosal surface. Treatment length was 3.0 cm. Patient was treated with 1 channel using 7 dwell positions. Treatment time  was 276.1 seconds. Iridium 192 was the high-dose-rate source for treatment. The patient tolerated the treatment well. After completion of her therapy, a radiation survey was performed documenting return of the iridium source into the GammaMed safe.   PLAN: The patient will return next week for her fourth high-dose-rate treatment.  ________________________________  -----------------------------------  Blair Promise, PhD, MD  This document serves as a record of services personally performed by Gery Pray, MD. It was created on his behalf by Roney Mans, a trained medical scribe. The creation of this record is based on the scribe's personal observations and the provider's statements to them. This document has been checked and approved by the attending provider.

## 2021-10-29 ENCOUNTER — Telehealth: Payer: Self-pay | Admitting: *Deleted

## 2021-10-29 NOTE — Telephone Encounter (Signed)
CALLED PATIENT TO REMIND OF HDR [place] 10-30-21 @ 2 PM, SPOKE WITH PATIENT AND SHE IS AWARE OF THIS [place].

## 2021-10-29 NOTE — Progress Notes (Signed)
  Radiation Oncology         [phone] ________________________________  Name: Haley Davis MRN: [id number]  Date: 10/30/2021  DOB: October 21, 1955  CC: Leamon Arnt, MD  Leamon Arnt, MD  HDR BRACHYTHERAPY NOTE  DIAGNOSIS: The encounter diagnosis was Endometrial cancer [facility]).   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 She will be treated with a 2.5 cm diameter segmented cylinder. This conforms to her anatomy without undue discomfort.  Vaginal brachytherapy procedure node: The patient was brought to the [place] suite. Identity was confirmed. All relevant records and images related to the planned course of therapy were reviewed. The patient freely provided informed written consent to proceed with treatment after reviewing the details related to the planned course of therapy. The consent form was witnessed and verified by the simulation staff. Then, the patient was set-up in a stable reproducible supine position for radiation therapy. Pelvic exam revealed the vaginal cuff to be intact . The patient's custom vaginal cylinder was placed in the proximal vagina. This was affixed to the CT/MR stabilization plate to prevent slippage. Patient tolerated the placement well.  Verification simulation note:  A fiducial marker was placed within the vaginal cylinder. An AP and lateral film was then obtained through the pelvis area. This documented accurate position of the vaginal cylinder for treatment.  HDR BRACHYTHERAPY TREATMENT  The remote afterloading device was affixed to the vaginal cylinder by catheter. Patient then proceeded to undergo her fourth high-dose-rate treatment directed at the proximal vagina. The patient was prescribed a dose of 6.0 gray to be delivered to the mucosal surface. Treatment length was 3.0 cm. Patient was treated with 1 channel using 7 dwell positions. Treatment time  was 297.7 seconds. Iridium 192 was the high-dose-rate source for treatment. The patient tolerated the treatment well. After completion of her therapy, a radiation survey was performed documenting return of the iridium source into the GammaMed safe.   PLAN: The patient will return next week for her fifth high-dose-rate treatment.  ________________________________  -----------------------------------  Blair Promise, PhD, MD  This document serves as a record of services personally performed by Gery Pray, MD. It was created on his behalf by Roney Mans, a trained medical scribe. The creation of this record is based on the scribe's personal observations and the provider's statements to them. This document has been checked and approved by the attending provider.

## 2021-10-30 ENCOUNTER — Other Ambulatory Visit: Payer: Self-pay

## 2021-10-30 ENCOUNTER — Ambulatory Visit
Admission: RE | Admit: 2021-10-30 | Discharge: 2021-10-30 | Disposition: A | Discharge status: Home / Self Care | Payer: Medicare (Managed Care) | Source: Ambulatory Visit | Attending: Radiation Oncology | Admitting: Radiation Oncology

## 2021-10-30 DIAGNOSIS — C541 Malignant neoplasm of endometrium: Secondary | ICD-10-CM | POA: Insufficient documentation

## 2021-10-30 LAB — RAD ONC ARIA SESSION SUMMARY
Course Elapsed Days: 21
Plan Fractions Treated to Date: 4
Plan Prescribed Dose Per Fraction: 6 Gy
Plan Total Fractions Prescribed: 5
Plan Total Prescribed Dose: 30 Gy
Reference Point Dosage Given to Date: 24 Gy
Reference Point Session Dosage Given: 6 Gy
Session Number: 4

## 2021-11-05 ENCOUNTER — Telehealth: Payer: Self-pay | Admitting: *Deleted

## 2021-11-05 NOTE — Telephone Encounter (Signed)
Called patient to remind of [place]. for 11-06-21 @ 2 pm, spoke with patient and she is aware of this tx.

## 2021-11-05 NOTE — Progress Notes (Signed)
  Radiation Oncology         [phone] ________________________________  Name: Haley Davis MRN: [id number]  Date: 11/06/2021  DOB: [birth year]  CC: Leamon Arnt, MD  Leamon Arnt, MD  HDR BRACHYTHERAPY NOTE  DIAGNOSIS: The encounter diagnosis was Endometrial cancer [facility]   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 She will be treated with a 2.5 cm diameter segmented cylinder. This conforms to her anatomy without undue discomfort.  Vaginal brachytherapy procedure node: The patient was brought to the [place] suite. Identity was confirmed. All relevant records and images related to the planned course of therapy were reviewed. The patient freely provided informed written consent to proceed with treatment after reviewing the details related to the planned course of therapy. The consent form was witnessed and verified by the simulation staff. Then, the patient was set-up in a stable reproducible supine position for radiation therapy. Pelvic exam revealed the vaginal cuff to be intact . The patient's custom vaginal cylinder was placed in the proximal vagina. This was affixed to the CT/MR stabilization plate to prevent slippage. Patient tolerated the placement well.  Verification simulation note:  A fiducial marker was placed within the vaginal cylinder. An AP and lateral film was then obtained through the pelvis area. This documented accurate position of the vaginal cylinder for treatment.  HDR BRACHYTHERAPY TREATMENT  The remote afterloading device was affixed to the vaginal cylinder by catheter. Patient then proceeded to undergo her fifth high-dose-rate treatment directed at the proximal vagina. The patient was prescribed a dose of 6.0 gray to be delivered to the mucosal surface. Treatment length was 3.0 cm. Patient was treated with 1 channel using 7 dwell positions. Treatment time  was 317.9 seconds. Iridium 192 was the high-dose-rate source for treatment. The patient tolerated the treatment well. After completion of her therapy, a radiation survey was performed documenting return of the iridium source into the GammaMed safe.   PLAN: The patient has completed her fifth and final high-dose-rate treatment. She will return in one month for routine follow-up.  Overall she is tolerated her vaginal brachytherapy well.  She did report some urinary frequency and nocturia after the first 2-3 HDR treatments but none since.  She denied any hematuria or rectal bleeding or vaginal bleeding throughout the course of treatment. ________________________________  -----------------------------------  Blair Promise, PhD, MD  This document serves as a record of services personally performed by Gery Pray, MD. It was created on his behalf by Roney Mans, a trained medical scribe. The creation of this record is based on the scribe's personal observations and the provider's statements to them. This document has been checked and approved by the attending provider.

## 2021-11-06 ENCOUNTER — Other Ambulatory Visit: Payer: Self-pay

## 2021-11-06 ENCOUNTER — Encounter: Payer: Self-pay | Admitting: Radiation Oncology

## 2021-11-06 ENCOUNTER — Ambulatory Visit
Admission: RE | Admit: 2021-11-06 | Discharge: 2021-11-06 | Disposition: A | Discharge status: Home / Self Care | Payer: Medicare (Managed Care) | Source: Ambulatory Visit | Attending: Radiation Oncology | Admitting: Radiation Oncology

## 2021-11-06 DIAGNOSIS — C541 Malignant neoplasm of endometrium: Secondary | ICD-10-CM

## 2021-11-06 LAB — RAD ONC ARIA SESSION SUMMARY
Course Elapsed Days: 28
Plan Fractions Treated to Date: 5
Plan Prescribed Dose Per Fraction: 6 Gy
Plan Total Fractions Prescribed: 5
Plan Total Prescribed Dose: 30 Gy
Reference Point Dosage Given to Date: 30 Gy
Reference Point Session Dosage Given: 6 Gy
Session Number: 5

## 2021-11-29 LAB — HM MAMMOGRAPHY

## 2021-12-05 ENCOUNTER — Telehealth: Payer: Self-pay | Admitting: *Deleted

## 2021-12-05 NOTE — Telephone Encounter (Signed)
CALLED PATIENT TO ALTER FU ON 12-11-21 DUE TO DR. KINARD BEING IN THE OR, RESCHEDULED FOR 12-11-21 @ 11:45 AM, SPOKE WITH PATIENT AND SHE AGREED TO THE NEW TIME ON 12-11-21

## 2021-12-09 LAB — HM DEXA SCAN

## 2021-12-10 ENCOUNTER — Encounter: Payer: Self-pay | Admitting: Radiation Oncology

## 2021-12-10 NOTE — Progress Notes (Incomplete)
  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  She did report some urinary frequency and nocturia after the first 2-3 HDR treatments but none since.  She otherwise denied any hematuria, rectal bleeding, or vaginal bleeding throughout the course of treatment.  Plan: The patient will follow-up with radiation oncology in one month .  ________________________________________________ -----------------------------------  Blair Promise, PhD, MD  This document serves as a record of services personally performed by Gery Pray, MD. It was created on his behalf by Roney Mans, a trained medical scribe. The creation of this record is based on the scribe's personal observations and the provider's statements to them. This document has been checked and approved by the attending provider.'

## 2021-12-10 NOTE — Progress Notes (Signed)
  Radiation Oncology         [phone] ________________________________  Name: Haley Davis MRN: [id number]  Date: 12/11/2021  DOB: 02-May-1955  Follow-Up Visit Note  CC: Leamon Arnt, MD  Leamon Arnt, MD  No diagnosis found.  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 The patient tolerated her vaginal brachytherapy relatively well.  She did report some urinary frequency and nocturia after the first 2-3 HDR treatments but denied any additional episodes since. Otherwise, she denied any hematuria, rectal bleeding, or vaginal bleeding throughout the course of treatment.       No significant interval history since the patient completed radiation therapy.   ***                         Allergies:  is allergic to codeine and wellbutrin [bupropion].  Meds: Current Outpatient Medications  Medication Sig Dispense Refill   acetaminophen (TYLENOL) 500 MG tablet Take by mouth.     aspirin EC 325 MG tablet Take 325 mg by mouth daily. (Patient not taking: Reported on 10/09/2021)     bifidobacterium infantis (ALIGN) capsule Take by mouth.     Cholecalciferol (VITAMIN D-3 PO) Take 2,000 Units by mouth daily.     Multiple Vitamin (MULTIVITAMIN WITH MINERALS) TABS Take 1 tablet by mouth daily. NO IRON     No current facility-administered medications for this encounter.    Physical Findings: The patient is in no acute distress. Patient is alert and oriented.  vitals were not taken for this visit. .  No significant changes. Lungs are clear to auscultation bilaterally. Heart has regular rate and rhythm. No palpable cervical, supraclavicular, or  axillary adenopathy. Abdomen soft, non-tender, normal bowel sounds.   Lab Findings: Lab Results  Component Value Date   WBC 6.6 08/04/2012   HGB 14.0 08/04/2012   HCT 40.0 08/04/2012   MCV 82.3 08/04/2012   PLT 257 08/04/2012    Radiographic Findings: No results found.  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  ***  Plan:  ***   *** minutes of total time was spent for this patient encounter, including preparation, face-to-face counseling with the patient and coordination of care, physical exam, and documentation of the encounter. ____________________________________  Blair Promise, PhD, MD  This document serves as a record of services personally performed by Gery Pray, MD. It was created on his behalf by Roney Mans, a trained medical scribe. The creation of this record is based on the scribe's personal observations and the provider's statements to them. This document has been checked and approved by the attending provider.

## 2021-12-11 ENCOUNTER — Ambulatory Visit
Admission: RE | Admit: 2021-12-11 | Discharge: 2021-12-11 | Disposition: A | Discharge status: Home / Self Care | Payer: Medicare (Managed Care) | Source: Ambulatory Visit | Attending: Radiation Oncology | Admitting: Radiation Oncology

## 2021-12-11 ENCOUNTER — Other Ambulatory Visit: Payer: Self-pay

## 2021-12-11 VITALS — BP 133/91 | HR 61 | Temp 97.9°F | Resp 18 | Ht 64.0 in | Wt 156.4 lb

## 2021-12-11 DIAGNOSIS — Z923 Personal history of irradiation: Secondary | ICD-10-CM | POA: Insufficient documentation

## 2021-12-11 DIAGNOSIS — C541 Malignant neoplasm of endometrium: Secondary | ICD-10-CM | POA: Insufficient documentation

## 2021-12-11 DIAGNOSIS — Z7982 Long term (current) use of aspirin: Secondary | ICD-10-CM | POA: Diagnosis not present

## 2021-12-11 HISTORY — DX: Personal history of irradiation: Z92.3

## 2021-12-11 NOTE — Progress Notes (Signed)
Haley Davis is here today for follow up post radiation to the pelvic.  They completed their radiation on: 11/06/2021   Does the patient complain of any of the following:  Pain:no Abdominal bloating: no Diarrhea/Constipation: no Nausea/Vomiting: no Vaginal Discharge: no Blood in Urine or Stool: no Urinary Issues (dysuria/incomplete emptying/ incontinence/ increased frequency/urgency): more frequency especially at night.  Denies dysuria. Post radiation skin changes: no   Additional comments if applicable She has been given a size S and S+ vaginal dilators and was given instructions to use them 3 times a week for 10 minutes.  BP (!) 133/91 (BP Location: Left Arm, Patient Position: Sitting)   Pulse 61   Temp 97.9 F (36.6 C) (Oral)   Resp 18   Ht '5\' 4"'$  (1.626 m)   Wt 156 lb 6 oz (70.9 kg)   SpO2 100%   BMI 26.84 kg/m  Sh

## 2022-01-19 ENCOUNTER — Encounter: Payer: Self-pay | Admitting: *Deleted

## 2022-09-08 ENCOUNTER — Ambulatory Visit (INDEPENDENT_AMBULATORY_CARE_PROVIDER_SITE_OTHER): Payer: Medicare (Managed Care) | Admitting: Ophthalmology

## 2022-09-08 ENCOUNTER — Encounter (INDEPENDENT_AMBULATORY_CARE_PROVIDER_SITE_OTHER): Payer: Self-pay | Admitting: Ophthalmology

## 2022-09-08 DIAGNOSIS — H43813 Vitreous degeneration, bilateral: Secondary | ICD-10-CM | POA: Diagnosis not present

## 2022-09-08 DIAGNOSIS — Z961 Presence of intraocular lens: Secondary | ICD-10-CM | POA: Diagnosis not present

## 2022-09-08 DIAGNOSIS — H35033 Hypertensive retinopathy, bilateral: Secondary | ICD-10-CM | POA: Diagnosis not present

## 2022-09-08 DIAGNOSIS — I10 Essential (primary) hypertension: Secondary | ICD-10-CM | POA: Diagnosis not present

## 2022-09-08 NOTE — Progress Notes (Signed)
[facility] - Clinic Note  09/08/2022   CHIEF COMPLAINT Patient presents for Retina Evaluation  HISTORY OF PRESENT ILLNESS: Haley Davis is a 67 y.o. female who presents to the clinic today for:  HPI     Retina Evaluation   In right eye.  This started 1 day ago.  Duration of 1 day.  Associated Symptoms Floaters.  Response to treatment was no improvement.  I, the attending physician,  performed the HPI with the patient and updated documentation appropriately.        Comments   Retina eval per Dr Dione Booze for possible retina tear OD pt is reporting no vision changes noticed she is having a large new floater in her right eye she denies any flashes       Last edited by Rennis Chris, MD on 09/08/2022  9:47 PM.    Pt is here on the referral of Alma Downs, PA-C for concern of floaters OD, possible retinal tear OD, pt states she has been seeing a large new floater in her right eye for about a week, she states she has had floaters "forever", but this one was much different, she states it moves around in her vision and blocks vision when it's stationary, pt had cataract sx OU with Dr. Marchelle Gearing  Referring physician: [facility] [address] [place],  [place] [address]  HISTORICAL INFORMATION:  Selected notes from the MEDICAL RECORD NUMBER Referred by Dr. Alma Downs, PA-C for concern of retinal tear OD LEE:  Ocular Hx- PMH-   CURRENT MEDICATIONS: No current outpatient medications on file. (Ophthalmic Drugs)   No current facility-administered medications for this visit. (Ophthalmic Drugs)   Current Outpatient Medications (Other)  Medication Sig   acetaminophen (TYLENOL) 500 MG tablet Take by mouth.   aspirin EC 325 MG tablet Take 325 mg by mouth daily. (Patient not taking: Reported on 10/09/2021)   bifidobacterium infantis (ALIGN) capsule Take by mouth.   Cholecalciferol (VITAMIN D-3 PO) Take 2,000 Units by mouth daily.   Iron,  Ferrous Sulfate, 325 (65 Fe) MG TABS    Multiple Vitamin (MULTIVITAMIN WITH MINERALS) TABS Take 1 tablet by mouth daily. NO IRON   No current facility-administered medications for this visit. (Other)   REVIEW OF SYSTEMS: ROS   Positive for: Cardiovascular, Eyes, Allergic/Imm Last edited by Etheleen Mayhew, COT on 09/08/2022  8:29 AM.     ALLERGIES Allergies  Allergen Reactions   Codeine Anaphylaxis   Wellbutrin [Bupropion]     constipation   PAST MEDICAL HISTORY Past Medical History:  Diagnosis Date   Cancer (HCC)    Environmental allergies    History of radiation therapy    Endometrium- 10/09/21-11/06/21- [facility]   Hypertension    Past Surgical History:  Procedure Laterality Date    right arm surgery Right    BACK SURGERY     MANDIBLE FRACTURE SURGERY     FAMILY HISTORY Family History  Problem Relation Age of Onset   Hyperlipidemia Mother    Hypertension Mother    Cancer Father    Acute myelogenous leukemia Father    Cancer Maternal Aunt    Ovarian cancer Maternal Aunt    Breast cancer Maternal Aunt    Stroke Maternal Grandfather    Uterine cancer Paternal Grandmother    SOCIAL HISTORY Social History   Tobacco Use   Smoking status: Never   Smokeless tobacco: Never  Vaping Use   Vaping Use:  Never used  Substance Use Topics   Alcohol use: No   Drug use: No       OPHTHALMIC EXAM:  Base Eye Exam     Visual Acuity (Snellen - Linear)       Right Left   Dist cc 20/20 -1 20/20    Correction: Glasses         Tonometry (Tonopen, 8:32 AM)       Right Left   Pressure 11 12         Pupils       Pupils Dark Light Shape React APD   Right PERRL 3 2 Round Brisk None   Left PERRL 3 2 Round Brisk None         Visual Fields       Left Right    Full Full         Extraocular Movement       Right Left    Full, Ortho Full, Ortho         Neuro/Psych     Oriented x3: Yes   Mood/Affect: Normal         Dilation      Both eyes: 2.5% Phenylephrine @ 8:32 AM           Slit Lamp and Fundus Exam     Slit Lamp Exam       Right Left   Lids/Lashes Dermatochalasis - upper lid Dermatochalasis - upper lid   Conjunctiva/Sclera White and quiet White and quiet   Cornea trace tear film debris, endo pigment, well healed cataract wound trace PEE, well healed cataract wound   Anterior Chamber deep and clear deep and clear   Iris Round and dilated Round and dilated   Lens PC IOL in good position Toric PC IOL in good position with marks at 0300 and 0900   Anterior Vitreous Vitreous syneresis, +fine pigment, Posterior vitreous detachment, vitreous condensations Vitreous syneresis, +fine pigment, Posterior vitreous detachment, mild vitreous condensations         Fundus Exam       Right Left   Disc Compact, tilted, trace pallor, PPA mild Pallor, Sharp rim, Compact, tilted, mild PPA   C/D Ratio 0.2 0.1   Macula Flat, Blunted foveal reflex, Drusen, RPE mottling, No heme or edema Flat, Blunted foveal reflex, Drusen, RPE mottling, No heme or edema   Vessels attenuated, mild tortuosity attenuated, mild tortuosity   Periphery Attached, pigmented cystoid degeneration inferiorly, No RT/RD on 360 scleral depression Attached, peripheral cystoid degeneration, peripheral drusen, No heme           IMAGING AND PROCEDURES  Imaging and Procedures for 09/08/2022  OCT, Retina - OU - Both Eyes       Right Eye Quality was good. Central Foveal Thickness: 288. Progression has no prior data. Findings include normal foveal contour, no IRF, no SRF, myopic contour.   Left Eye Quality was good. Central Foveal Thickness: 333. Progression has no prior data. Findings include normal foveal contour, no IRF, no SRF, myopic contour (Trace cystic changes IN fovea).   Notes *Images captured and stored on drive  Diagnosis / Impression:  NFP; no IRF/SRF OU Myopic contour OU  Clinical management:  See below  Abbreviations: NFP -  Normal foveal profile. CME - cystoid macular edema. PED - pigment epithelial detachment. IRF - intraretinal fluid. SRF - subretinal fluid. EZ - ellipsoid zone. ERM - epiretinal membrane. ORA - outer retinal atrophy. ORT - outer retinal tubulation. SRHM -  subretinal hyper-reflective material. IRHM - intraretinal hyper-reflective material           ASSESSMENT/PLAN:   ICD-10-CM   1. Posterior vitreous detachment of both eyes  H43.813 OCT, Retina - OU - Both Eyes    2. Essential hypertension  I10     3. Hypertensive retinopathy of both eyes  H35.033 OCT, Retina - OU - Both Eyes    4. Pseudophakia, both eyes  Z96.1      1. PVD OU  - acute, symptomatic PVD OD  - pt reports 1 wk history of new onset floater OD; no photopsias  - Discussed findings and prognosis  - No RT or RD on 360 scleral depressed exam  - Reviewed s/s of RT/RD  - Strict return precautions for any such RT/RD signs/symptoms  - f/u in 6 wks, sooner prn -- DFE/OCT  2,3. Hypertensive retinopathy OU - discussed importance of tight BP control - monitor  4. Pseudophakia OU  - s/p CE/IOL (Dr. Zetta Bills)  - IOLs in good position, doing well  - monitor   Ophthalmic Meds Ordered this visit:  No orders of the defined types were placed in this encounter.    Return in about 6 weeks (around 10/20/2022) for f/u PVD OU, DFE, OCT.  There are no Patient Instructions on file for this visit.  Explained the diagnoses, plan, and follow up with the patient and they expressed understanding.  Patient expressed understanding of the importance of proper follow up care.   This document serves as a record of services personally performed by Karie Chimera, MD, PhD. It was created on their behalf by Glee Arvin. Manson Passey, OA an ophthalmic technician. The creation of this record is the provider's dictation and/or activities during the visit.    Electronically signed by: Glee Arvin. Kristopher Oppenheim 05.14.2024 9:56 PM   Karie Chimera, M.D.,  Ph.D. Diseases & Surgery of the Retina and Vitreous [facility] 09/08/2022  I have reviewed the above documentation for accuracy and completeness, and I agree with the above. Karie Chimera, M.D., Ph.D. 09/08/22 9:59 PM   Abbreviations: M myopia (nearsighted); A astigmatism; H hyperopia (farsighted); P presbyopia; Mrx spectacle prescription;  CTL contact lenses; OD right eye; OS left eye; OU both eyes  XT exotropia; ET esotropia; PEK punctate epithelial keratitis; PEE punctate epithelial erosions; DES dry eye syndrome; MGD meibomian gland dysfunction; ATs artificial tears; PFAT's preservative free artificial tears; NSC nuclear sclerotic cataract; PSC posterior subcapsular cataract; ERM epi-retinal membrane; PVD posterior vitreous detachment; RD retinal detachment; DM diabetes mellitus; DR diabetic retinopathy; NPDR non-proliferative diabetic retinopathy; PDR proliferative diabetic retinopathy; CSME clinically significant macular edema; DME diabetic macular edema; dbh dot blot hemorrhages; CWS cotton wool spot; POAG primary open angle glaucoma; C/D cup-to-disc ratio; HVF humphrey visual field; GVF goldmann visual field; OCT optical coherence tomography; IOP intraocular pressure; BRVO Branch retinal vein occlusion; CRVO central retinal vein occlusion; CRAO central retinal artery occlusion; BRAO branch retinal artery occlusion; RT retinal tear; SB scleral buckle; PPV pars plana vitrectomy; VH Vitreous hemorrhage; PRP panretinal laser photocoagulation; IVK intravitreal kenalog; VMT vitreomacular traction; MH Macular hole;  NVD neovascularization of the disc; NVE neovascularization elsewhere; AREDS age related eye disease study; ARMD age related macular degeneration; POAG primary open angle glaucoma; EBMD epithelial/anterior basement membrane dystrophy; ACIOL anterior chamber intraocular lens; IOL intraocular lens; PCIOL posterior chamber intraocular lens; Phaco/IOL phacoemulsification  with intraocular lens placement; PRK photorefractive keratectomy; LASIK laser assisted in situ keratomileusis; HTN hypertension;  DM diabetes mellitus; COPD chronic obstructive pulmonary disease

## 2022-10-06 NOTE — Progress Notes (Signed)
[facility] - Clinic Note  10/20/2022   CHIEF COMPLAINT Patient presents for Retina Follow Up  HISTORY OF PRESENT ILLNESS: Haley Davis is a 67 y.o. female who presents to the clinic today for:  HPI     Retina Follow Up   Patient presents with  PVD.  In both eyes.  This started weeks ago.  I, the attending physician,  performed the HPI with the patient and updated documentation appropriately.        Comments   Patient feels that the floater has gone away. She is using Pataday.       Last edited by Rennis Chris, MD on 10/21/2022 12:37 PM.    Pt states her floaters are improved   Referring physician: [facility] [address] [place],  [place] [address]  HISTORICAL INFORMATION:  Selected notes from the MEDICAL RECORD NUMBER Referred by Dr. Alma Downs, PA-C for concern of retinal tear OD LEE:  Ocular Hx- PMH-   CURRENT MEDICATIONS: No current outpatient medications on file. (Ophthalmic Drugs)   No current facility-administered medications for this visit. (Ophthalmic Drugs)   Current Outpatient Medications (Other)  Medication Sig   acetaminophen (TYLENOL) 500 MG tablet Take by mouth.   aspirin EC 325 MG tablet Take 325 mg by mouth daily.   bifidobacterium infantis (ALIGN) capsule Take by mouth.   Cholecalciferol (VITAMIN D-3 PO) Take 2,000 Units by mouth daily.   Iron, Ferrous Sulfate, 325 (65 Fe) MG TABS    Multiple Vitamin (MULTIVITAMIN WITH MINERALS) TABS Take 1 tablet by mouth daily. NO IRON   No current facility-administered medications for this visit. (Other)   REVIEW OF SYSTEMS: ROS   Positive for: Cardiovascular, Eyes, Allergic/Imm Last edited by Charlette Caffey, COT on 10/20/2022  1:53 PM.      ALLERGIES Allergies  Allergen Reactions   Codeine Anaphylaxis   Wellbutrin [Bupropion]     constipation   PAST MEDICAL HISTORY Past Medical History:  Diagnosis Date   Cancer (HCC)    Environmental  allergies    History of radiation therapy    Endometrium- 10/09/21-11/06/21- Antony [facility]bird   Hypertension    Past Surgical History:  Procedure Laterality Date    right arm surgery Right    BACK SURGERY     MANDIBLE FRACTURE SURGERY     FAMILY HISTORY Family History  Problem Relation Age of Onset   Hyperlipidemia Mother    Hypertension Mother    Cancer Father    Acute myelogenous leukemia Father    Cancer Maternal Aunt    Ovarian cancer Maternal Aunt    Breast cancer Maternal Aunt    Stroke Maternal Grandfather    Uterine cancer Paternal Grandmother    SOCIAL HISTORY Social History   Tobacco Use   Smoking status: Never   Smokeless tobacco: Never  Vaping Use   Vaping Use: Never used  Substance Use Topics   Alcohol use: No   Drug use: No       OPHTHALMIC EXAM:  Base Eye Exam     Visual Acuity (Snellen - Linear)       Right Left   Dist cc 20/20 +2 20/20    Correction: Glasses         Tonometry (Tonopen, 1:55 PM)       Right Left   Pressure 14 10         Pupils       Dark Light Shape React  APD   Right 3 2 Round Brisk None   Left 3 2 Round Brisk None         Visual Fields       Left Right    Full Full         Extraocular Movement       Right Left    Full, Ortho Full, Ortho         Neuro/Psych     Oriented x3: Yes   Mood/Affect: Normal         Dilation     Both eyes: 1.0% Mydriacyl, 2.5% Phenylephrine @ 1:53 PM           Slit Lamp and Fundus Exam     Slit Lamp Exam       Right Left   Lids/Lashes Dermatochalasis - upper lid Dermatochalasis - upper lid   Conjunctiva/Sclera White and quiet White and quiet   Cornea trace tear film debris, endo pigment, well healed cataract wound trace PEE, well healed cataract wound   Anterior Chamber deep and clear deep and clear   Iris Round and dilated Round and dilated   Lens PC IOL in good position Toric PC IOL in good position with marks at 0300 and 0900   Anterior Vitreous  Vitreous syneresis, +fine pigment, Posterior vitreous detachment, vitreous condensations Vitreous syneresis, +fine pigment, Posterior vitreous detachment, mild vitreous condensations         Fundus Exam       Right Left   Disc Compact, tilted, trace pallor, PPA mild Pallor, Sharp rim, Compact, tilted, mild PPA   C/D Ratio 0.2 0.1   Macula Flat, Blunted foveal reflex, Drusen, RPE mottling, No heme or edema Flat, Blunted foveal reflex, Drusen, RPE mottling, No heme or edema   Vessels attenuated, mild tortuosity attenuated, mild tortuosity   Periphery Attached, pigmented cystoid degeneration inferiorly, No RT/RD, No heme Attached, peripheral cystoid degeneration, peripheral drusen, No heme           IMAGING AND PROCEDURES  Imaging and Procedures for 10/20/2022  OCT, Retina - OU - Both Eyes       Right Eye Quality was good. Central Foveal Thickness: 339. Progression has been stable. Findings include normal foveal contour, no IRF, no SRF, myopic contour.   Left Eye Quality was good. Central Foveal Thickness: 303. Progression has been stable. Findings include normal foveal contour, no IRF, no SRF, myopic contour (Trace cystic changes IN fovea).   Notes *Images captured and stored on drive  Diagnosis / Impression:  NFP; no IRF/SRF OU Myopic contour OU  Clinical management:  See below  Abbreviations: NFP - Normal foveal profile. CME - cystoid macular edema. PED - pigment epithelial detachment. IRF - intraretinal fluid. SRF - subretinal fluid. EZ - ellipsoid zone. ERM - epiretinal membrane. ORA - outer retinal atrophy. ORT - outer retinal tubulation. SRHM - subretinal hyper-reflective material. IRHM - intraretinal hyper-reflective material           ASSESSMENT/PLAN:   ICD-10-CM   1. Posterior vitreous detachment of both eyes  H43.813 OCT, Retina - OU - Both Eyes    2. Essential hypertension  I10     3. Hypertensive retinopathy of both eyes  H35.033     4. Pseudophakia,  both eyes  Z96.1      1. PVD OU  - acute, symptomatic PVD OD in May 2024  - pt reported 1 wk history of new onset floater OD; no photopsias  - today, symptoms improved  -  Discussed findings and prognosis  - No RT or RD on 360 on exam  - Reviewed s/s of RT/RD  - Strict return precautions for any such RT/RD signs/symptoms  - pt is cleared from a retina standpoint for release to [facility] and resumption of primary eye care  2,3. Hypertensive retinopathy OU - discussed importance of tight BP control - monitor  4. Pseudophakia OU  - s/p CE/IOL OU (Dr. Zetta Bills)  - IOLs in good position, doing well  - monitor   Ophthalmic Meds Ordered this visit:  No orders of the defined types were placed in this encounter.    Return if symptoms worsen or fail to improve.  There are no Patient Instructions on file for this visit.  Explained the diagnoses, plan, and follow up with the patient and they expressed understanding.  Patient expressed understanding of the importance of proper follow up care.   This document serves as a record of services personally performed by Karie Chimera, MD, PhD. It was created on their behalf by Gerilyn Nestle, COT an ophthalmic technician. The creation of this record is the provider's dictation and/or activities during the visit.    Electronically signed by:  Gerilyn Nestle, COT  6.11.24 12:37 PM  This document serves as a record of services personally performed by Karie Chimera, MD, PhD. It was created on their behalf by Glee Arvin. Manson Passey, OA an ophthalmic technician. The creation of this record is the provider's dictation and/or activities during the visit.    Electronically signed by: Glee Arvin. Kristopher Oppenheim 06.25.2024 12:37 PM   Karie Chimera, M.D., Ph.D. Diseases & Surgery of the Retina and Vitreous [facility]  I have reviewed the above documentation for accuracy and completeness, and I agree with the above. Karie Chimera, M.D., Ph.D. 10/21/22 12:39 PM   Abbreviations: M myopia (nearsighted); A astigmatism; H hyperopia (farsighted); P presbyopia; Mrx spectacle prescription;  CTL contact lenses; OD right eye; OS left eye; OU both eyes  XT exotropia; ET esotropia; PEK punctate epithelial keratitis; PEE punctate epithelial erosions; DES dry eye syndrome; MGD meibomian gland dysfunction; ATs artificial tears; PFAT's preservative free artificial tears; NSC nuclear sclerotic cataract; PSC posterior subcapsular cataract; ERM epi-retinal membrane; PVD posterior vitreous detachment; RD retinal detachment; DM diabetes mellitus; DR diabetic retinopathy; NPDR non-proliferative diabetic retinopathy; PDR proliferative diabetic retinopathy; CSME clinically significant macular edema; DME diabetic macular edema; dbh dot blot hemorrhages; CWS cotton wool spot; POAG primary open angle glaucoma; C/D cup-to-disc ratio; HVF humphrey visual field; GVF goldmann visual field; OCT optical coherence tomography; IOP intraocular pressure; BRVO Branch retinal vein occlusion; CRVO central retinal vein occlusion; CRAO central retinal artery occlusion; BRAO branch retinal artery occlusion; RT retinal tear; SB scleral buckle; PPV pars plana vitrectomy; VH Vitreous hemorrhage; PRP panretinal laser photocoagulation; IVK intravitreal kenalog; VMT vitreomacular traction; MH Macular hole;  NVD neovascularization of the disc; NVE neovascularization elsewhere; AREDS age related eye disease study; ARMD age related macular degeneration; POAG primary open angle glaucoma; EBMD epithelial/anterior basement membrane dystrophy; ACIOL anterior chamber intraocular lens; IOL intraocular lens; PCIOL posterior chamber intraocular lens; Phaco/IOL phacoemulsification with intraocular lens placement; PRK photorefractive keratectomy; LASIK laser assisted in situ keratomileusis; HTN hypertension; DM diabetes mellitus; COPD chronic obstructive pulmonary disease

## 2022-10-08 ENCOUNTER — Ambulatory Visit: Payer: Medicare (Managed Care) | Admitting: Internal Medicine

## 2022-10-20 ENCOUNTER — Ambulatory Visit (INDEPENDENT_AMBULATORY_CARE_PROVIDER_SITE_OTHER): Payer: Medicare (Managed Care) | Admitting: Ophthalmology

## 2022-10-20 ENCOUNTER — Encounter (INDEPENDENT_AMBULATORY_CARE_PROVIDER_SITE_OTHER): Payer: Self-pay | Admitting: Ophthalmology

## 2022-10-20 DIAGNOSIS — H43813 Vitreous degeneration, bilateral: Secondary | ICD-10-CM | POA: Diagnosis not present

## 2022-10-20 DIAGNOSIS — Z961 Presence of intraocular lens: Secondary | ICD-10-CM | POA: Diagnosis not present

## 2022-10-20 DIAGNOSIS — H35033 Hypertensive retinopathy, bilateral: Secondary | ICD-10-CM

## 2022-10-20 DIAGNOSIS — I10 Essential (primary) hypertension: Secondary | ICD-10-CM | POA: Diagnosis not present

## 2022-10-21 ENCOUNTER — Encounter (INDEPENDENT_AMBULATORY_CARE_PROVIDER_SITE_OTHER): Payer: Self-pay | Admitting: Ophthalmology

## 2023-01-06 ENCOUNTER — Ambulatory Visit (INDEPENDENT_AMBULATORY_CARE_PROVIDER_SITE_OTHER): Payer: Medicare (Managed Care) | Admitting: Family Medicine

## 2023-01-06 ENCOUNTER — Encounter: Payer: Self-pay | Admitting: Family Medicine

## 2023-01-06 VITALS — BP 142/88 | HR 94 | Temp 98.6°F | Ht 64.0 in | Wt 198.7 lb

## 2023-01-06 DIAGNOSIS — F4321 Adjustment disorder with depressed mood: Secondary | ICD-10-CM | POA: Insufficient documentation

## 2023-01-06 DIAGNOSIS — C541 Malignant neoplasm of endometrium: Secondary | ICD-10-CM

## 2023-01-06 DIAGNOSIS — I10 Essential (primary) hypertension: Secondary | ICD-10-CM

## 2023-01-06 DIAGNOSIS — F320 Major depressive disorder, single episode, mild: Secondary | ICD-10-CM

## 2023-01-06 DIAGNOSIS — D126 Benign neoplasm of colon, unspecified: Secondary | ICD-10-CM

## 2023-01-06 DIAGNOSIS — K635 Polyp of colon: Secondary | ICD-10-CM | POA: Insufficient documentation

## 2023-01-06 DIAGNOSIS — Z8679 Personal history of other diseases of the circulatory system: Secondary | ICD-10-CM | POA: Insufficient documentation

## 2023-01-06 HISTORY — DX: Major depressive disorder, single episode, mild: F32.0

## 2023-01-06 LAB — CBC WITH DIFFERENTIAL/PLATELET
Basophils Absolute: 0 10*3/uL (ref 0.0–0.1)
Basophils Relative: 0.8 % (ref 0.0–3.0)
Eosinophils Absolute: 0.1 10*3/uL (ref 0.0–0.7)
Eosinophils Relative: 2.3 % (ref 0.0–5.0)
HCT: 42.8 % (ref 36.0–46.0)
Hemoglobin: 14.1 g/dL (ref 12.0–15.0)
Lymphocytes Relative: 21.3 % (ref 12.0–46.0)
Lymphs Abs: 0.9 10*3/uL (ref 0.7–4.0)
MCHC: 32.8 g/dL (ref 30.0–36.0)
MCV: 90.3 fl (ref 78.0–100.0)
Monocytes Absolute: 0.4 10*3/uL (ref 0.1–1.0)
Monocytes Relative: 10 % (ref 3.0–12.0)
Neutro Abs: 2.7 10*3/uL (ref 1.4–7.7)
Neutrophils Relative %: 65.6 % (ref 43.0–77.0)
Platelets: 226 10*3/uL (ref 150.0–400.0)
RBC: 4.75 Mil/uL (ref 3.87–5.11)
RDW: 14.9 % (ref 11.5–15.5)
WBC: 4.1 10*3/uL (ref 4.0–10.5)

## 2023-01-06 LAB — LIPID PANEL
Cholesterol: 193 mg/dL (ref 0–200)
HDL: 82.1 mg/dL (ref >39.00)
LDL Cholesterol: 94 mg/dL (ref 0–99)
NonHDL: 110.97
Total CHOL/HDL Ratio: 2
Triglycerides: 85 mg/dL (ref 0.0–149.0)
VLDL: 17 mg/dL (ref 0.0–40.0)

## 2023-01-06 LAB — COMPREHENSIVE METABOLIC PANEL
ALT: 10 U/L (ref 0–35)
AST: 15 U/L (ref 0–37)
Albumin: 4 g/dL (ref 3.5–5.2)
Alkaline Phosphatase: 56 U/L (ref 39–117)
BUN: 24 mg/dL — ABNORMAL HIGH (ref 6–23)
CO2: 24 meq/L (ref 19–32)
Calcium: 9.8 mg/dL (ref 8.4–10.5)
Chloride: 105 meq/L (ref 96–112)
Creatinine, Ser: 0.97 mg/dL (ref 0.40–1.20)
GFR: 59.86 mL/min — ABNORMAL LOW (ref >60.00)
Glucose, Bld: 72 mg/dL (ref 70–99)
Potassium: 4.1 meq/L (ref 3.5–5.1)
Sodium: 138 meq/L (ref 135–145)
Total Bilirubin: 0.4 mg/dL (ref 0.2–1.2)
Total Protein: 7.4 g/dL (ref 6.0–8.3)

## 2023-01-06 LAB — TSH: TSH: 3.01 u[IU]/mL (ref 0.35–5.50)

## 2023-01-06 MED ORDER — ESCITALOPRAM OXALATE 10 MG PO TABS: 10.0000 mg | ORAL_TABLET | Freq: Every day | ORAL | 3 refills | Status: DC

## 2023-01-06 NOTE — Progress Notes (Signed)
Subjective  CC:  Chief Complaint  Patient presents with   Establish Care   Hypertension    HPI: Haley Davis is a 67 y.o. female who presents to the office today to address the problems listed above in the chief complaint. Haley Davis returns to reestablish care.  I last saw her in 2020.  I have reviewed multiple medical records in the interim. Endometrial cancer: She had complained of abdominal bloating and abdominal pain associate with nausea and significant weight loss.  Ultimately was found to have an enlarged uterus and biopsy showed endometrial cancer.  FIGO stage I.  Successfully treated with vaginal cuff brachytherapy and hysterectomy with bilateral salpingo-oophorectomy and radiation therapy.  Fortunately she is now doing well. Hypertension f/u: Control is fair . Pt reports she is doing well.  She had been on medications for blood pressure control but with significant weight loss medications were able to be stopped.  She checks her blood pressure routinely at home and averages 130s over 70s.  No chest pain or shortness of breath.  She is no longer on blood pressure medications.  However she has regained some of her weight back.  She exercises regularly at [facility] well every day. She did have colonoscopy and upper endoscopy during her workup.  She did have tubular adenomas noted. She lost her mother prior to her diagnosis and is now suffering from some depressive symptoms.  She believes in part she was not able to grieve because of her own diagnosis of cancer.  She is now finding herself down, tearful at times.  Irritable.  Has never been before diagnosed with depression.  No panic symptoms.  She is retired.  Assessment  1. Endometrial cancer (HCC)   2. Essential hypertension   3. Tubular adenoma of colon   4. Complicated grief   5. Current mild episode of major depressive disorder without prior episode [facility])      Plan  Endometrial cancer: Status posttreatment.  Fortunately doing  well Hypertension f/u: Blood pressure mildly elevated today.  Blood pressures at home are normal.  She will continue to monitor and I will recheck at next visit.  Low threshold to restart medications.  She will monitor her diet, keep low-salt and work on weight loss Tubular adenoma: Recheck colonoscopy in 3 years Discussed depression and complicated grief: Will start Lexapro.  Education given  Education regarding management of these chronic disease states was given. Management strategies discussed on successive visits include dietary and exercise recommendations, goals of achieving and maintaining IBW, and lifestyle modifications aiming for adequate sleep and minimizing stressors.   Follow up: 6 weeks to recheck blood pressure and mood  Orders Placed This Encounter  Procedures   HM MAMMOGRAPHY   HM DEXA SCAN   CBC with Differential/Platelet   Comprehensive metabolic panel   Lipid panel   TSH   Meds ordered this encounter  Medications   escitalopram (LEXAPRO) 10 MG tablet    Sig: Take 1 tablet (10 mg total) by mouth daily.    Dispense:  90 tablet    Refill:  3      BP Readings from Last 3 Encounters:  01/06/23 (!) 142/88  12/11/21 (!) 133/91  10/09/21 (!) 147/90   Wt Readings from Last 3 Encounters:  01/06/23 198 lb 11.2 oz (90.1 kg)  12/11/21 156 lb 6 oz (70.9 kg)  10/09/21 155 lb 2 oz (70.4 kg)    Lab Results  Component Value Date   CHOL 193 01/06/2023  Lab Results  Component Value Date   HDL 82.10 01/06/2023   Lab Results  Component Value Date   LDLCALC 94 01/06/2023   Lab Results  Component Value Date   TRIG 85.0 01/06/2023   Lab Results  Component Value Date   CHOLHDL 2 01/06/2023   No results found for: "LDLDIRECT" Lab Results  Component Value Date   CREATININE 0.97 01/06/2023   BUN 24 (H) 01/06/2023   NA 138 01/06/2023   K 4.1 01/06/2023   CL 105 01/06/2023   CO2 24 01/06/2023    The 10-year ASCVD risk score (Arnett DK, et al., 2019) is:  11%   Values used to calculate the score:     Age: 77 years     Sex: Female     Is Non-Hispanic African American: No     Diabetic: No     Tobacco smoker: No     Systolic Blood Pressure: 142 mmHg     Is BP treated: Yes     HDL Cholesterol: 82.1 mg/dL     Total Cholesterol: 193 mg/dL  I reviewed the patients updated PMH, FH, and SocHx.    Patient Active Problem List   Diagnosis Date Noted   Endometrial cancer (HCC) 10/01/2021    Priority: High   Essential hypertension 08/17/2012    Priority: High   Angle-closure glaucoma, secondary, bilateral, mild stage 10/01/2016    Priority: Medium    Epistaxis 08/17/2012    Priority: Medium    Allergic rhinitis 08/24/2012    Priority: Low   Colonic polyp 01/06/2023   History of hypertension 01/06/2023   Current mild episode of major depressive disorder without prior episode (HCC) 01/06/2023   Tubular adenoma of colon 01/06/2023   Complicated grief 01/06/2023   BMI 25.0-25.9,adult 08/12/2021    Allergies: Codeine and Wellbutrin [bupropion]  Social History: Patient  reports that she has never smoked. She has never used smokeless tobacco. She reports that she does not drink alcohol and does not use drugs.  Current Meds  Medication Sig   acetaminophen (TYLENOL) 500 MG tablet Take by mouth.   aspirin EC 325 MG tablet Take 325 mg by mouth daily.   bifidobacterium infantis (ALIGN) capsule Take by mouth.   Cholecalciferol (VITAMIN D-3 PO) Take 2,000 Units by mouth daily.   escitalopram (LEXAPRO) 10 MG tablet Take 1 tablet (10 mg total) by mouth daily.   Iron, Ferrous Sulfate, 325 (65 Fe) MG TABS    Multiple Vitamin (MULTIVITAMIN WITH MINERALS) TABS Take 1 tablet by mouth daily. NO IRON    Review of Systems: Cardiovascular: negative for chest pain, palpitations, leg swelling, orthopnea Respiratory: negative for SOB, wheezing or persistent cough Gastrointestinal: negative for abdominal pain Genitourinary: negative for dysuria or gross  hematuria  Objective  Vitals: BP (!) 142/88   Pulse 94   Temp 98.6 F (37 C)   Ht 5\' 4"  (1.626 m)   Wt 198 lb 11.2 oz (90.1 kg)   SpO2 95%   BMI 34.11 kg/m  General: no acute distress  Psych:  Alert and oriented, normal mood and affect HEENT:  Normocephalic, atraumatic, supple neck  Cardiovascular:  RRR without murmur. no edema Respiratory:  Good breath sounds bilaterally, CTAB with normal respiratory effort Skin:  Warm, no rashes Neurologic:   Mental status is normal Commons side effects, risks, benefits, and alternatives for medications and treatment plan prescribed today were discussed, and the patient expressed understanding of the given instructions. Patient is instructed to call or message  via MyChart if he/she has any questions or concerns regarding our treatment plan. No barriers to understanding were identified. We discussed Red Flag symptoms and signs in detail. Patient expressed understanding regarding what to do in case of urgent or emergency type symptoms.  Medication list was reconciled, printed and provided to the patient in AVS. Patient instructions and summary information was reviewed with the patient as documented in the AVS. This note was prepared with assistance of Dragon voice recognition software. Occasional wrong-word or sound-a-like substitutions may have occurred due to the inherent limitation

## 2023-01-07 NOTE — Progress Notes (Signed)
Labs reviewed.  Monitor lipids. Reports normotensive at home  Dear Ms. Kevan Ny, Thank you for allowing me to care for you at your recent office visit.  I wanted to let you know that I have reviewed your lab test results and am happy to report that they are all normal.  Things are stable.  It was very nice seeing you again. Welcome back!  Sincerely, Dr. Mardelle Matte

## 2023-02-02 LAB — HM MAMMOGRAPHY

## 2023-02-08 ENCOUNTER — Ambulatory Visit: Payer: Medicare (Managed Care) | Admitting: Family Medicine

## 2023-02-17 ENCOUNTER — Encounter: Payer: Self-pay | Admitting: Family Medicine

## 2023-02-17 ENCOUNTER — Ambulatory Visit: Payer: Medicare (Managed Care) | Admitting: Family Medicine

## 2023-02-17 VITALS — BP 136/88 | HR 93 | Temp 98.1°F | Ht 64.0 in | Wt 203.6 lb

## 2023-02-17 DIAGNOSIS — I10 Essential (primary) hypertension: Secondary | ICD-10-CM | POA: Diagnosis not present

## 2023-02-17 DIAGNOSIS — F320 Major depressive disorder, single episode, mild: Secondary | ICD-10-CM | POA: Diagnosis not present

## 2023-02-17 DIAGNOSIS — F4321 Adjustment disorder with depressed mood: Secondary | ICD-10-CM

## 2023-02-17 NOTE — Progress Notes (Signed)
Subjective  CC:  Chief Complaint  Patient presents with   Hypertension   Depression   Cancer    HPI: Haley Davis is a 67 y.o. female who presents to the office today to address the problems listed above in the chief complaint. Hypertension f/u: Control is fair . Pt reports she is doing well. taking medications as instructed, no medication side effects noted, no TIAs, no chest pain on exertion, no dyspnea on exertion, no swelling of ankles. She reports she thinks she is on benicar hct 20/12.5 but needs to verify. Last visit, I believed she was not on medication for blood pressure, however, she reports that it was restarted about 6 months ago. She cannot tolerate the bb due to low mood and fatigue. She denies adverse effects from his BP medications. Compliance with medication is good. Home readings are running borderline.  Grief and depression: doing much better on lexapro 10. Better sleep, less worry, less negativity and brighter mood. Had gi side effects initially, but these have resolved.   Assessment  1. Essential hypertension   2. Complicated grief   3. Current mild episode of major depressive disorder without prior episode (HCC)      Plan   Hypertension f/u: BP control is fairly well controlled. She will verify which medication she is on and then I will adjust up the dose.  Depression/grief: now controlled. Continue lexapro 10.   Education regarding management of these chronic disease states was given. Management strategies discussed on successive visits include dietary and exercise recommendations, goals of achieving and maintaining IBW, and lifestyle modifications aiming for adequate sleep and minimizing stressors.   Follow up: 6 mo for cpe and recheck bp  No orders of the defined types were placed in this encounter.  No orders of the defined types were placed in this encounter.     BP Readings from Last 3 Encounters:  02/17/23 136/88  01/06/23 (!) 142/88   12/11/21 (!) 133/91   Wt Readings from Last 3 Encounters:  02/17/23 203 lb 9.6 oz (92.4 kg)  01/06/23 198 lb 11.2 oz (90.1 kg)  12/11/21 156 lb 6 oz (70.9 kg)    Lab Results  Component Value Date   CHOL 193 01/06/2023   Lab Results  Component Value Date   HDL 82.10 01/06/2023   Lab Results  Component Value Date   LDLCALC 94 01/06/2023   Lab Results  Component Value Date   TRIG 85.0 01/06/2023   Lab Results  Component Value Date   CHOLHDL 2 01/06/2023   No results found for: "LDLDIRECT" Lab Results  Component Value Date   CREATININE 0.97 01/06/2023   BUN 24 (H) 01/06/2023   NA 138 01/06/2023   K 4.1 01/06/2023   CL 105 01/06/2023   CO2 24 01/06/2023    The 10-year ASCVD risk score (Arnett DK, et al., 2019) is: 11.5%   Values used to calculate the score:     Age: 66 years     Sex: Female     Is Non-Hispanic African American: No     Diabetic: No     Tobacco smoker: No     Systolic Blood Pressure: 136 mmHg     Is BP treated: Yes     HDL Cholesterol: 82.1 mg/dL     Total Cholesterol: 193 mg/dL  I reviewed the patients updated PMH, FH, and SocHx.    Patient Active Problem List   Diagnosis Date Noted   Endometrial cancer (  HCC) 10/01/2021    Priority: High   Essential hypertension 08/17/2012    Priority: High   Angle-closure glaucoma, secondary, bilateral, mild stage 10/01/2016    Priority: Medium    Epistaxis 08/17/2012    Priority: Medium    Allergic rhinitis 08/24/2012    Priority: Low   Colonic polyp 01/06/2023   History of hypertension 01/06/2023   Current mild episode of major depressive disorder without prior episode (HCC) 01/06/2023   Tubular adenoma of colon 01/06/2023   Complicated grief 01/06/2023   BMI 25.0-25.9,adult 08/12/2021    Allergies: Codeine and Wellbutrin [bupropion]  Social History: Patient  reports that she has never smoked. She has never used smokeless tobacco. She reports that she does not drink alcohol and does not use  drugs.  Current Meds  Medication Sig   acetaminophen (TYLENOL) 500 MG tablet Take by mouth.   aspirin EC 325 MG tablet Take 325 mg by mouth daily.   bifidobacterium infantis (ALIGN) capsule Take by mouth.   Cholecalciferol (VITAMIN D-3 PO) Take 2,000 Units by mouth daily.   escitalopram (LEXAPRO) 10 MG tablet Take 1 tablet (10 mg total) by mouth daily.   Iron, Ferrous Sulfate, 325 (65 Fe) MG TABS    Multiple Vitamin (MULTIVITAMIN WITH MINERALS) TABS Take 1 tablet by mouth daily. NO IRON   Olmesartan-amLODIPine-HCTZ 20-5-12.5 MG TABS Take by mouth.    Review of Systems: Cardiovascular: negative for chest pain, palpitations, leg swelling, orthopnea Respiratory: negative for SOB, wheezing or persistent cough Gastrointestinal: negative for abdominal pain Genitourinary: negative for dysuria or gross hematuria  Objective  Vitals: BP 136/88   Pulse 93   Temp 98.1 F (36.7 C)   Ht 5\' 4"  (1.626 m)   Wt 203 lb 9.6 oz (92.4 kg)   SpO2 96%   BMI 34.95 kg/m  General: no acute distress  Psych:  Alert and oriented, normal mood and affect HEENT:  Normocephalic, atraumatic, supple neck  Cardiovascular:  RRR without murmur. no edema Respiratory:  Good breath sounds bilaterally, CTAB with normal respiratory effort Skin:  Warm, no rashes Neurologic:   Mental status is normal Commons side effects, risks, benefits, and alternatives for medications and treatment plan prescribed today were discussed, and the patient expressed understanding of the given instructions. Patient is instructed to call or message via MyChart if he/she has any questions or concerns regarding our treatment plan. No barriers to understanding were identified. We discussed Red Flag symptoms and signs in detail. Patient expressed understanding regarding what to do in case of urgent or emergency type symptoms.  Medication list was reconciled, printed and provided to the patient in AVS. Patient instructions and summary information  was reviewed with the patient as documented in the AVS. This note was prepared with assistance of Dragon voice recognition software. Occasional wrong-word or sound-a-like substitutions may have occurred due to the inherent limitation

## 2023-02-17 NOTE — Patient Instructions (Signed)
Please return in 6 months for your annual complete physical; please come fasting.   If you have any questions or concerns, please don't hesitate to send me a message via MyChart or call the office at [phone]. Thank you for visiting with us today! It's our pleasure caring for you.  

## 2023-02-18 MED ORDER — OLMESARTAN MEDOXOMIL-HCTZ 40-12.5 MG PO TABS: 1.0000 | ORAL_TABLET | Freq: Every day | ORAL | 3 refills | Status: DC

## 2023-03-10 ENCOUNTER — Ambulatory Visit: Payer: Medicare (Managed Care)

## 2023-03-10 VITALS — Wt 203.0 lb

## 2023-03-10 DIAGNOSIS — Z0000 Encounter for general adult medical examination without abnormal findings: Secondary | ICD-10-CM

## 2023-03-10 NOTE — Patient Instructions (Signed)
Haley Davis , Thank you for taking time to come for your Medicare Wellness Visit. I appreciate your ongoing commitment to your health goals. Please review the following plan we discussed and let me know if I can assist you in the future.   Referrals/Orders/Follow-Ups/Clinician Recommendations: maintain health and activity  Aim for 30 minutes of exercise or brisk walking, 6-8 glasses of water, and 5 servings of fruits and vegetables each day.   This is a list of the screening recommended for you and due dates:  Health Maintenance  Topic Date Due   COVID-19 Vaccine (10 - 2023-24 season) 04/07/2023   Mammogram  02/02/2024   Medicare Annual Wellness Visit  03/09/2024   Colon Cancer Screening  05/28/2024   DEXA scan (bone density measurement)  12/09/2024   DTaP/Tdap/Td vaccine (4 - Td or Tdap) 09/15/2032   Pneumonia Vaccine  Completed   Flu Shot  Completed   Hepatitis C Screening  Completed   Zoster (Shingles) Vaccine  Completed   HPV Vaccine  Aged Out    Advanced directives: (In Chart) A copy of your advanced directives are scanned into your chart should your provider ever need it.  Next Medicare Annual Wellness Visit scheduled for next year: Yes

## 2023-03-10 NOTE — Progress Notes (Signed)
Subjective:   Haley Davis is a 67 y.o. female who presents for an Initial Medicare Annual Wellness Visit.  Visit Complete: Virtual I connected with  Haley Davis on 03/10/23 by a audio enabled telemedicine application and verified that I am speaking with the correct person using two identifiers.  Patient Location: Home  Provider Location: Home Office  I discussed the limitations of evaluation and management by telemedicine. The patient expressed understanding and agreed to proceed.  Vital Signs: Because this visit was a virtual/telehealth visit, some criteria may be missing or patient reported. Any vitals not documented were not able to be obtained and vitals that have been documented are patient reported.  Patient Medicare AWV questionnaire was completed by the patient on 03/06/23; I have confirmed that all information answered by patient is correct and no changes since this date.  Cardiac Risk Factors include: advanced age (>94men, >32 women);dyslipidemia;hypertension;obesity (BMI >30kg/m2)     Objective:    Today's Vitals   03/10/23 1353  Weight: 203 lb (92.1 kg)   Body mass index is 34.84 kg/m.     03/10/2023    2:03 PM 12/11/2021   11:42 AM 10/09/2021    8:08 AM 10/01/2021    1:45 PM  Advanced Directives  Does Patient Have a Medical Advance Directive? Yes Yes No Yes  Type of Estate agent of [place];Living will Healthcare Power of [place];Living will  Healthcare Power of Attorney  Does patient want to make changes to medical advance directive? No - Patient declined   Yes (MAU/Ambulatory/Procedural Areas - Information given)  Copy of Healthcare Power of Attorney in Chart? Yes - validated most recent copy scanned in chart (See row information)   No - copy requested    Current Medications (verified) Outpatient Encounter Medications as of 03/10/2023  Medication Sig   acetaminophen (TYLENOL) 500 MG tablet Take by mouth.   aspirin EC 325 MG tablet  Take 325 mg by mouth daily.   bifidobacterium infantis (ALIGN) capsule Take by mouth.   Cholecalciferol (VITAMIN D-3 PO) Take 2,000 Units by mouth daily.   escitalopram (LEXAPRO) 10 MG tablet Take 1 tablet (10 mg total) by mouth daily.   Iron, Ferrous Sulfate, 325 (65 Fe) MG TABS    Multiple Vitamin (MULTIVITAMIN WITH MINERALS) TABS Take 1 tablet by mouth daily. NO IRON   olmesartan-hydrochlorothiazide (BENICAR HCT) 40-12.5 MG tablet Take 1 tablet by mouth daily.   No facility-administered encounter medications on file as of 03/10/2023.    Allergies (verified) Codeine, Lisinopril, and Wellbutrin [bupropion]   History: Past Medical History:  Diagnosis Date   Cancer (HCC)    Environmental allergies    History of radiation therapy    Endometrium- 10/09/21-11/06/21- [facility]bird   Hypertension    Past Surgical History:  Procedure Laterality Date    right arm surgery Right    BACK SURGERY     MANDIBLE FRACTURE SURGERY     Family History  Problem Relation Age of Onset   Hyperlipidemia Mother    Hypertension Mother    Cancer Father    Acute myelogenous leukemia Father    Cancer Maternal Aunt    Ovarian cancer Maternal Aunt    Breast cancer Maternal Aunt    Stroke Maternal Grandfather    Uterine cancer Paternal Grandmother    Social History   Socioeconomic History   Marital status: Single    Spouse name: Not on file   Number of children: 0   Years of education:  Not on file   Highest education level: Bachelor's degree (e.g., BA, AB, BS)  Occupational History   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    Employer: [employer]  Tobacco Use   Smoking status: Never   Smokeless tobacco: Never  Vaping Use   Vaping status: Never Used  Substance and Sexual Activity   Alcohol use: No   Drug use: Never   Sexual activity: Not Currently  Other Topics Concern   Not on file  Social History Narrative   Not on file   Social Determinants of Health   Financial Resource Strain: Low  Risk  (03/06/2023)   Overall Financial Resource Strain (CARDIA)    Difficulty of Paying Living Expenses: Not hard at all  Food Insecurity: No Food Insecurity (03/06/2023)   Hunger Vital Sign    Worried About Running Out of Food in the Last Year: Never true    Ran Out of Food in the Last Year: Never true  Transportation Needs: No Transportation Needs (03/06/2023)   PRAPARE - Administrator, Civil Service (Medical): No    Lack of Transportation (Non-Medical): No  Physical Activity: Sufficiently Active (03/06/2023)   Exercise Vital Sign    Days of Exercise per Week: 5 days    Minutes of Exercise per Session: 30 min  Stress: No Stress Concern Present (03/06/2023)   [employer] of Occupational Health - Occupational Stress Questionnaire    Feeling of Stress : Only a little  Social Connections: Moderately Integrated (03/06/2023)   Social Connection and Isolation Panel [NHANES]    Frequency of Communication with Friends and Family: More than three times a week    Frequency of Social Gatherings with Friends and Family: Once a week    Attends Religious Services: More than 4 times per year    Active Member of [employer] or Organizations: Yes    Attends Engineer, structural: More than 4 times per year    Marital Status: Never married    Tobacco Counseling Counseling given: Not Answered   Clinical Intake:  Pre-visit preparation completed: Yes  Pain : No/denies pain     BMI - recorded: 34.84 Nutritional Status: BMI > 30  Obese Nutritional Risks: None Diabetes: No  How often do you need to have someone help you when you read instructions, pamphlets, or other written materials from your doctor or pharmacy?: 1 - Never  Interpreter Needed?: No  Information entered by :: Lanier Ensign, LPN   Activities of Daily Living    03/06/2023    8:41 AM  In your present state of health, do you have any difficulty performing the following activities:  Hearing? 0  Vision? 0   Difficulty concentrating or making decisions? 0  Walking or climbing stairs? 0  Dressing or bathing? 0  Doing errands, shopping? 0  Preparing Food and eating ? N  Using the Toilet? N  In the past six months, have you accidently leaked urine? N  Do you have problems with loss of bowel control? N  Managing your Medications? N  Managing your Finances? N  Housekeeping or managing your Housekeeping? N    Patient Care Team: Willow Ora, MD as PCP - General (Family Medicine) Clifton James, Roselie Awkward, MD as Consulting Physician (Gynecologic Oncology) Caver, Albertina Parr, NP as Nurse Practitioner (Dermatology) Concepcion Elk, MD as Consulting Physician (Gastroenterology)  Indicate any recent Medical Services you may have received from other than [facility] providers in the past year (date may be approximate).  Assessment:   This is a routine wellness examination for Vaishali.  Hearing/Vision screen Hearing Screening - Comments:: Pt denies any hearing issues  Vision Screening - Comments:: Pt follows Dr Dione Booze for annual eye exams    Goals Addressed             This Visit's Progress    Patient Stated       Maintain health and activity       Depression Screen    03/10/2023    2:01 PM 02/17/2023    9:03 AM 01/06/2023    9:33 AM 01/30/2019    1:15 PM  PHQ 2/9 Scores  PHQ - 2 Score 0 0 0 0    Fall Risk    03/06/2023    8:41 AM 02/17/2023    9:03 AM 01/06/2023    9:33 AM 01/30/2019    1:15 PM  Fall Risk   Falls in the past year? 0 0 0 0  Number falls in past yr:  0 0 1  Injury with Fall?  0 0 0  Risk for fall due to : No Fall Risks No Fall Risks No Fall Risks   Follow up Falls prevention discussed Falls evaluation completed Falls evaluation completed Falls evaluation completed    MEDICARE RISK AT HOME: Medicare Risk at Home Any stairs in or around the home?: Yes If so, are there any without handrails?: No Home free of loose throw rugs in walkways, pet beds, electrical  cords, etc?: Yes Adequate lighting in your home to reduce risk of falls?: Yes Life alert?: No Use of a cane, walker or w/c?: No Grab bars in the bathroom?: No Shower chair or bench in shower?: No Elevated toilet seat or a handicapped toilet?: No  TIMED UP AND GO:  Was the test performed? No    Cognitive Function:        03/10/2023    2:04 PM  6CIT Screen  What Year? 0 points  What month? 0 points  What time? 0 points  Count back from 20 0 points  Months in reverse 0 points  Repeat phrase 0 points  Total Score 0 points    Immunizations Immunization History  Administered Date(s) Administered   Fluad Trivalent(High Dose 65+) 02/10/2023   Influenza, Quadrivalent, Recombinant, Inj, Pf 01/29/2016, 02/26/2017   Influenza, Seasonal, Injecte, Preservative Fre 01/26/2015   Influenza-Unspecified 02/14/2013   Moderna Covid-19 Fall Seasonal Vaccine 21yrs & older 02/10/2022, 09/24/2022   Moderna Covid-19 Vaccine Bivalent Booster 15yrs & up 02/10/2023   PFIZER Comirnaty(Gray Top)Covid-19 Tri-Sucrose Vaccine 09/05/2020   PFIZER(Purple Top)SARS-COV-2 Vaccination 07/06/2019, 07/26/2019, 01/31/2020   PNEUMOCOCCAL CONJUGATE-20 12/07/2021   Pfizer Covid-19 Vaccine Bivalent Booster 52yrs & up 02/25/2021, 11/24/2021   Pneumococcal Polysaccharide-23 02/14/2013   RSV,unspecified 02/10/2022   Td 04/27/2000   Tdap 02/14/2013, 09/16/2022   Zoster Recombinant(Shingrix) 11/24/2021, 01/13/2022   Zoster, Live 06/13/2015    TDAP status: Up to date  Flu Vaccine status: Up to date  Pneumococcal vaccine status: Up to date  Covid-19 vaccine status: Information provided on how to obtain vaccines.   Qualifies for Shingles Vaccine? Yes   Zostavax completed Yes   Shingrix Completed?: Yes  Screening Tests Health Maintenance  Topic Date Due   COVID-19 Vaccine (10 - 2023-24 season) 04/07/2023   MAMMOGRAM  02/02/2024   Medicare Annual Wellness (AWV)  03/09/2024   Colonoscopy  05/28/2024   DEXA  SCAN  12/09/2024   DTaP/Tdap/Td (4 - Td or Tdap) 09/15/2032   Pneumonia Vaccine 65+  Years old  Completed   INFLUENZA VACCINE  Completed   Hepatitis C Screening  Completed   Zoster Vaccines- Shingrix  Completed   HPV VACCINES  Aged Out    Health Maintenance  There are no preventive care reminders to display for this patient.   Colorectal cancer screening: Type of screening: Colonoscopy. Completed 05/28/21. Repeat every 3 years  Mammogram status: Completed 02/02/23. Repeat every year  Bone Density status: Completed 12/09/21. Results reflect: Bone density results: NORMAL. Repeat every 3 years.   Additional Screening:  Hepatitis C Screening: Completed 10/01/16  Vision Screening: Recommended annual ophthalmology exams for early detection of glaucoma and other disorders of the eye. Is the patient up to date with their annual eye exam?  Yes  Who is the provider or what is the name of the office in which the patient attends annual eye exams? Dr Dione Booze  If pt is not established with a provider, would they like to be referred to a provider to establish care? No .   Dental Screening: Recommended annual dental exams for proper oral hygiene   Community Resource Referral / Chronic Care Management: CRR required this visit?  No   CCM required this visit?  No     Plan:     I have personally reviewed and noted the following in the patient's chart:   Medical and social history Use of alcohol, tobacco or illicit drugs  Current medications and supplements including opioid prescriptions. Patient is not currently taking opioid prescriptions. Functional ability and status Nutritional status Physical activity Advanced directives List of other physicians Hospitalizations, surgeries, and ER visits in previous 12 months Vitals Screenings to include cognitive, depression, and falls Referrals and appointments  In addition, I have reviewed and discussed with patient certain preventive protocols,  quality metrics, and best practice recommendations. A written personalized care plan for preventive services as well as general preventive health recommendations were provided to patient.     Marzella Schlein, LPN   [id number]   After Visit Summary: (MyChart) Due to this being a telephonic visit, the after visit summary with patients personalized plan was offered to patient via MyChart   Nurse Notes: none

## 2023-04-21 ENCOUNTER — Other Ambulatory Visit: Payer: Self-pay | Admitting: Medical Genetics

## 2023-05-28 ENCOUNTER — Other Ambulatory Visit (HOSPITAL_COMMUNITY)
Admission: RE | Admit: 2023-05-28 | Discharge: 2023-05-28 | Disposition: A | Discharge status: Home / Self Care | Payer: Self-pay | Source: Ambulatory Visit | Attending: Oncology | Admitting: Oncology

## 2023-05-28 ENCOUNTER — Encounter (INDEPENDENT_AMBULATORY_CARE_PROVIDER_SITE_OTHER): Payer: Self-pay

## 2023-06-10 LAB — GENECONNECT MOLECULAR SCREEN: Genetic Analysis Overall Interpretation: NEGATIVE

## 2023-08-19 ENCOUNTER — Encounter: Payer: Medicare (Managed Care) | Admitting: Family Medicine

## 2023-09-29 ENCOUNTER — Encounter: Payer: Self-pay | Admitting: Family Medicine

## 2023-09-29 ENCOUNTER — Ambulatory Visit: Payer: Medicare (Managed Care) | Admitting: Family Medicine

## 2023-09-29 VITALS — BP 121/78 | HR 99 | Temp 97.7°F | Ht 64.0 in | Wt 222.2 lb

## 2023-09-29 DIAGNOSIS — E669 Obesity, unspecified: Secondary | ICD-10-CM | POA: Diagnosis not present

## 2023-09-29 DIAGNOSIS — F320 Major depressive disorder, single episode, mild: Secondary | ICD-10-CM | POA: Diagnosis not present

## 2023-09-29 DIAGNOSIS — D126 Benign neoplasm of colon, unspecified: Secondary | ICD-10-CM | POA: Diagnosis not present

## 2023-09-29 DIAGNOSIS — Z0000 Encounter for general adult medical examination without abnormal findings: Secondary | ICD-10-CM

## 2023-09-29 DIAGNOSIS — I10 Essential (primary) hypertension: Secondary | ICD-10-CM

## 2023-09-29 DIAGNOSIS — C541 Malignant neoplasm of endometrium: Secondary | ICD-10-CM

## 2023-09-29 DIAGNOSIS — Z0001 Encounter for general adult medical examination with abnormal findings: Secondary | ICD-10-CM

## 2023-09-29 LAB — LIPID PANEL
Cholesterol: 189 mg/dL (ref 0–200)
HDL: 62.1 mg/dL (ref >39.00)
LDL Cholesterol: 100 mg/dL — ABNORMAL HIGH (ref 0–99)
NonHDL: 126.5
Total CHOL/HDL Ratio: 3
Triglycerides: 134 mg/dL (ref 0.0–149.0)
VLDL: 26.8 mg/dL (ref 0.0–40.0)

## 2023-09-29 LAB — COMPREHENSIVE METABOLIC PANEL WITH GFR
ALT: 12 U/L (ref 0–35)
AST: 16 U/L (ref 0–37)
Albumin: 4.1 g/dL (ref 3.5–5.2)
Alkaline Phosphatase: 73 U/L (ref 39–117)
BUN: 22 mg/dL (ref 6–23)
CO2: 24 meq/L (ref 19–32)
Calcium: 9.6 mg/dL (ref 8.4–10.5)
Chloride: 104 meq/L (ref 96–112)
Creatinine, Ser: 0.88 mg/dL (ref 0.40–1.20)
GFR: 66.94 mL/min (ref >60.00)
Glucose, Bld: 86 mg/dL (ref 70–99)
Potassium: 4.2 meq/L (ref 3.5–5.1)
Sodium: 138 meq/L (ref 135–145)
Total Bilirubin: 0.3 mg/dL (ref 0.2–1.2)
Total Protein: 6.9 g/dL (ref 6.0–8.3)

## 2023-09-29 LAB — CBC WITH DIFFERENTIAL/PLATELET
Basophils Absolute: 0 10*3/uL (ref 0.0–0.1)
Basophils Relative: 0.3 % (ref 0.0–3.0)
Eosinophils Absolute: 0.1 10*3/uL (ref 0.0–0.7)
Eosinophils Relative: 1.8 % (ref 0.0–5.0)
HCT: 41.4 % (ref 36.0–46.0)
Hemoglobin: 13.9 g/dL (ref 12.0–15.0)
Lymphocytes Relative: 22.1 % (ref 12.0–46.0)
Lymphs Abs: 1.4 10*3/uL (ref 0.7–4.0)
MCHC: 33.7 g/dL (ref 30.0–36.0)
MCV: 87.7 fl (ref 78.0–100.0)
Monocytes Absolute: 0.5 10*3/uL (ref 0.1–1.0)
Monocytes Relative: 7.4 % (ref 3.0–12.0)
Neutro Abs: 4.4 10*3/uL (ref 1.4–7.7)
Neutrophils Relative %: 68.4 % (ref 43.0–77.0)
Platelets: 239 10*3/uL (ref 150.0–400.0)
RBC: 4.72 Mil/uL (ref 3.87–5.11)
RDW: 14.9 % (ref 11.5–15.5)
WBC: 6.4 10*3/uL (ref 4.0–10.5)

## 2023-09-29 LAB — TSH: TSH: 2.92 u[IU]/mL (ref 0.35–5.50)

## 2023-09-29 NOTE — Patient Instructions (Addendum)
 Please return in 6 months for hypertension and mood  follow up.   I will release your lab results to you on your MyChart account with further instructions. You may see the results before I do, but when I review them I will send you a message with my report or have my assistant call you if things need to be discussed. Please reply to my message with any questions. Thank you!   If you have any questions or concerns, please don't hesitate to send me a message via MyChart or call the office at [phone]. Thank you for visiting with us  today! It's our pleasure caring for you.    VISIT SUMMARY: During today's visit, we reviewed your current health status and discussed your ongoing treatments and concerns. Your mood has been stable with Lexapro , and your blood pressure is well-controlled with olmesartan . We also addressed your kidney function, joint pain, and general health maintenance.  YOUR PLAN: -DEPRESSION: Depression is a mood disorder characterized by persistent feelings of sadness and loss of interest. Your mood has significantly improved with Lexapro , and you prefer to continue this medication despite some weight gain. We will continue with the current dosage of Lexapro .  -HYPERTENSION: Hypertension, or high blood pressure, is a condition where the force of the blood against your artery walls is too high. Your blood pressure is well-controlled with your current medication regimen, with home readings around 116/65 mmHg.  -CHRONIC KIDNEY DISEASE: Chronic Kidney Disease is a long-term condition where the kidneys do not work as well as they should. Your creatinine levels are slightly elevated due to past kidney damage, so we will monitor your kidney function to ensure it remains stable. We will check your creatinine and GFR levels with your current blood work.  -OSTEOARTHRITIS OF THE THUMB: Osteoarthritis is a type of arthritis that occurs when flexible tissue at the ends of bones wears down. You  are experiencing joint pain in your thumb, likely due to prolonged use of a computer mouse. You can use Tylenol or Voltaren gel to manage the pain as needed.  -GENERAL HEALTH MAINTENANCE: You are up to date with your mammograms and vaccinations. You may consider getting another COVID-19 vaccine in the fall. You follow a healthy diet and lifestyle. We recommend repeating your mammogram in October.  INSTRUCTIONS: Please repeat your blood work today to monitor your conditions. Schedule a follow-up appointment in six months to review your blood pressure and mood.                      Contains text generated by [employer].                                 Contains text generated by [employer].

## 2023-09-29 NOTE — Progress Notes (Signed)
 Subjective  Chief Complaint  Patient presents with   Hypertension   Anxiety    Pt stated the medication is working well with her    Annual Exam    Pt here for Annual exam and is not currently fasting     HPI: Haley Davis is a 68 y.o. female who presents to [facility] [place] today for a Female Wellness Visit. She also has the concerns and/or needs as listed above in the chief complaint. These will be addressed in addition to the Health Maintenance Visit.   Wellness Visit: annual visit with health maintenance review and exam  HM: screens are all current.  History of tubular adenoma by colonoscopy in 2023, next colonoscopy due in 2026.  Status post hysterectomy.  Mammogram due in October.  All immunizations are current.  Inquires about COVID booster in the fall.  She is working out at [employer] well, eating a [employer].  Weight is up, partially contributed to Lexapro  use. Chronic disease f/u and/or acute problem visit: (deemed necessary to be done in addition to the wellness visit): Hypertension is well-controlled on Benicar  HCT.  Home readings are excellent.  No chest pain palpitations or shortness of breath.  No adverse effects Nonfasting for lipid screening No symptoms of diabetes Weight gain reviewed diet Depression: Much improved on Lexapro  10 daily.  Would like to continue.  Discussed risk versus benefits History of endometrial cancer Chronic kidney disease: Relates a story from 15 years ago when she had what sounds like to be as severe pyelonephritis.  Kidney function was depressed at that time by about 20%.  Currently feels well.  No recurrent infections.  Due for recheck.  Avoids NSAIDs  Assessment  1. Encounter for well adult exam with abnormal findings   2. Endometrial cancer (HCC)   3. Essential hypertension   4. Obesity (BMI 30-39.9)   5. Tubular adenoma of colon   6. Current mild episode of major depressive disorder without prior episode [facility])       Plan  Female Wellness Visit: Age appropriate Health Maintenance and Prevention measures were discussed with patient. Included topics are cancer screening recommendations, ways to keep healthy (see AVS) including dietary and exercise recommendations, regular eye and dental care, use of seat belts, and avoidance of moderate alcohol use and tobacco use.  BMI: discussed patient's BMI and encouraged positive lifestyle modifications to help get to or maintain a target BMI. HM needs and immunizations were addressed and ordered. See below for orders. See HM and immunization section for updates. Routine labs and screening tests ordered including cmp, cbc and lipids where appropriate. Discussed recommendations regarding Vit D and calcium supplementation (see AVS)  Chronic disease management visit and/or acute problem visit: Assessment and Plan Assessment & Plan Depression Significant mood improvement on Lexapro  despite weight gain. Prefers to continue medication until at least 2028. - Continue Lexapro  (escitalopram ) as prescribed.  10 mg daily  Hypertension Blood pressure well-controlled with current regimen. Home readings around 116/65 mmHg.  Benicar  HCT.  Check renal function electrolytes  Chronic Kidney Disease Slightly elevated creatinine due to past kidney damage. Monitoring GFR necessary to ensure it remains above 60. - Check creatinine and GFR levels with current blood work.  Avoid NSAIDs  Osteoarthritis of the thumb Joint pain in thumb likely due to prolonged mouse use. Condition may worsen over time. - Use Tylenol or Voltaren gel for pain management as needed.  History of endometrial cancer: Released from oncology.  General Health Maintenance Up to date with mammograms and vaccinations. Considering another COVID-19 vaccine in the fall. Follows a healthy diet. - Repeat mammogram in October. - Consider COVID-19 booster in the fall if desired.  Follow-up Blood work will be repeated to  ensure stability of conditions. Follow-up in six months to monitor blood pressure and mood. - Repeat blood work today. - Schedule follow-up appointment in six months.   Follow up: 6 months for blood pressure checkup and mood follow-up Orders Placed This Encounter  Procedures   CBC with Differential/Platelet   Comprehensive metabolic panel with GFR   Lipid panel   TSH   No orders of the defined types were placed in this encounter.     Body mass index is 38.14 kg/m. Wt Readings from Last 3 Encounters:  09/29/23 222 lb 3.2 oz (100.8 kg)  03/10/23 203 lb (92.1 kg)  02/17/23 203 lb 9.6 oz (92.4 kg)     Patient Active Problem List   Diagnosis Date Noted Date Diagnosed   Current mild episode of major depressive disorder without prior episode (HCC) 01/06/2023     Priority: High    Lexapro  10    Tubular adenoma of colon 01/06/2023     Priority: High   Endometrial cancer (HCC) 10/01/2021     Priority: High    She began to experience abdominal discomfort and pain. 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 Restarted with weight gain    Essential hypertension 08/17/2012     Priority: High    Benicar  hct    Angle-closure glaucoma, secondary, bilateral, mild stage 10/01/2016     Priority: Medium    Epistaxis 08/17/2012     Priority: Medium    Allergic rhinitis 08/24/2012     Priority: Low   Health Maintenance  Topic Date Due   COVID-19 Vaccine (10 - 2024-25 season) 10/15/2023 (Originally 04/07/2023)   INFLUENZA VACCINE  11/26/2023   MAMMOGRAM  02/02/2024   Medicare Annual Wellness (AWV)  03/09/2024   Colonoscopy  05/28/2024   DEXA SCAN  12/09/2024   DTaP/Tdap/Td (4 - Td or Tdap) 09/15/2032   Pneumonia Vaccine 21+ Years old  Completed   Hepatitis C Screening  Completed   Zoster Vaccines- Shingrix  Completed   HPV VACCINES  Aged Out   Meningococcal B Vaccine  Aged Out   Immunization History  Administered Date(s) Administered   Fluad Trivalent(High Dose 65+) 02/10/2023   Influenza, Quadrivalent, Recombinant, Inj, Pf 01/29/2016, 02/26/2017   Influenza, Seasonal, Injecte, Preservative Fre 01/26/2015   Influenza-Unspecified 02/14/2013   Moderna Covid-19 Fall Seasonal Vaccine 79yrs & older 02/10/2022, 09/24/2022   Moderna Covid-19 Vaccine Bivalent Booster 24yrs & up  02/10/2023   PFIZER Comirnaty(Gray Top)Covid-19 Tri-Sucrose Vaccine 09/05/2020   PFIZER(Purple Top)SARS-COV-2 Vaccination 07/06/2019, 07/26/2019, 01/31/2020   PNEUMOCOCCAL CONJUGATE-20 12/07/2021   Pfizer Covid-19 Vaccine Bivalent Booster 14yrs & up 02/25/2021, 11/24/2021   Pneumococcal Polysaccharide-23 02/14/2013   RSV,unspecified 02/10/2022   Td 04/27/2000   Tdap 02/14/2013, 09/16/2022   Zoster Recombinant(Shingrix) 11/24/2021, 01/13/2022   Zoster, Live 06/13/2015   We updated and reviewed the patient's past history in detail and it is documented below. Allergies: Patient is allergic to codeine, lisinopril, and wellbutrin [bupropion]. Past  Medical History Patient  has a past medical history of Cancer (HCC), Depression, Environmental allergies, History of radiation therapy, and Hypertension. Past Surgical History Patient  has a past surgical history that includes  right arm surgery (Right); Mandible fracture surgery; Back surgery; and Abdominal hysterectomy. Family History: Patient family history includes Acute myelogenous leukemia in her father; Breast cancer in her maternal aunt; Cancer in her father and maternal aunt; Hyperlipidemia in her mother; Hypertension in her mother; Ovarian cancer in her maternal aunt; Stroke in her maternal grandfather; Uterine cancer in her paternal grandmother. Social History:  Patient  reports that she has never smoked. She has never used smokeless tobacco. She reports that she does not drink alcohol and does not use drugs.  Review of Systems: Constitutional: negative for fever or malaise Ophthalmic: negative for photophobia, double vision or loss of vision Cardiovascular: negative for chest pain, dyspnea on exertion, or new LE swelling Respiratory: negative for SOB or persistent cough Gastrointestinal: negative for abdominal pain, change in bowel habits or melena Genitourinary: negative for dysuria or gross hematuria, no abnormal uterine bleeding or disharge Musculoskeletal: negative for new gait disturbance or muscular weakness Integumentary: negative for new or persistent rashes, no breast lumps Neurological: negative for TIA or stroke symptoms Psychiatric: negative for SI or delusions Allergic/Immunologic: negative for hives  Patient Care Team    Relationship Specialty Notifications Start End  Luevenia Saha, MD PCP - General Family Medicine  01/06/23   Soledad Dupes, MD Consulting Physician Gynecologic Oncology  01/06/23   Gordy Lauber, NP Nurse Practitioner Dermatology  01/06/23   Porfirio Bristol, MD Consulting Physician Gastroenterology  01/06/23     Objective  Vitals: BP  121/78   Pulse 99   Temp 97.7 F (36.5 C)   Ht 5\' 4"  (1.626 m)   Wt 222 lb 3.2 oz (100.8 kg)   SpO2 94%   BMI 38.14 kg/m  General:  Well developed, well nourished, no acute distress  Psych:  Alert and orientedx3,normal mood and affect HEENT:  Normocephalic, atraumatic, non-icteric sclera,  supple neck without adenopathy, mass or thyromegaly Cardiovascular:  Normal S1, S2, RRR without gallop, rub or murmur Respiratory:  Good breath sounds bilaterally, CTAB with normal respiratory effort Gastrointestinal: normal bowel sounds, soft, non-tender, no noted masses. No HSM MSK: extremities without edema, joints without erythema or swelling Neurologic:    Mental status is normal.  Gross motor and sensory exams are normal.  No tremor  Commons side effects, risks, benefits, and alternatives for medications and treatment plan prescribed today were discussed, and the patient expressed understanding of the given instructions. Patient is instructed to call or message via MyChart if he/she has any questions or concerns regarding our treatment plan. No barriers to understanding were identified. We discussed Red Flag symptoms and signs in detail. Patient expressed understanding regarding what to do in case of urgent or emergency type symptoms.  Medication list was reconciled, printed and provided to the  patient in AVS. Patient instructions and summary information was reviewed with the patient as documented in the AVS. This note was prepared with assistance of Dragon voice recognition software. Occasional wrong-word or sound-a-like substitutions may have occurred due to the inherent limitations of voice recognition software

## 2023-10-03 ENCOUNTER — Ambulatory Visit: Payer: Self-pay | Admitting: Family Medicine

## 2023-10-03 DIAGNOSIS — E782 Mixed hyperlipidemia: Secondary | ICD-10-CM

## 2023-10-03 NOTE — Progress Notes (Signed)
 Labs reviewed.  The 10-year ASCVD risk score (Arnett DK, et al., 2019) is: 9.7%   Values used to calculate the score:     Age: 68 years     Sex: Female     Is Non-Hispanic African American: No     Diabetic: No     Tobacco smoker: No     Systolic Blood Pressure: 121 mmHg     Is BP treated: Yes     HDL Cholesterol: 62.1 mg/dL     Total Cholesterol: 189 mg/dL

## 2023-10-04 DIAGNOSIS — E782 Mixed hyperlipidemia: Secondary | ICD-10-CM | POA: Insufficient documentation

## 2023-10-04 MED ORDER — ATORVASTATIN CALCIUM 10 MG PO TABS: 10.0000 mg | ORAL_TABLET | Freq: Every day | ORAL | 3 refills | Status: DC

## 2024-01-26 ENCOUNTER — Encounter: Payer: Self-pay | Admitting: Family Medicine

## 2024-01-26 ENCOUNTER — Ambulatory Visit: Payer: Medicare (Managed Care) | Admitting: Family Medicine

## 2024-01-26 VITALS — BP 124/76 | HR 97 | Temp 98.0°F | Ht 64.0 in | Wt 229.0 lb

## 2024-01-26 DIAGNOSIS — R053 Chronic cough: Secondary | ICD-10-CM

## 2024-01-26 DIAGNOSIS — R058 Other specified cough: Secondary | ICD-10-CM | POA: Diagnosis not present

## 2024-01-26 DIAGNOSIS — E782 Mixed hyperlipidemia: Secondary | ICD-10-CM | POA: Diagnosis not present

## 2024-01-26 DIAGNOSIS — T464X5A Adverse effect of angiotensin-converting-enzyme inhibitors, initial encounter: Secondary | ICD-10-CM

## 2024-01-26 DIAGNOSIS — I10 Essential (primary) hypertension: Secondary | ICD-10-CM | POA: Diagnosis not present

## 2024-01-26 MED ORDER — AZITHROMYCIN 250 MG PO TABS: ORAL_TABLET | ORAL | 0 refills | Status: DC

## 2024-01-26 MED ORDER — PREDNISONE 10 MG PO TABS: ORAL_TABLET | ORAL | 0 refills | Status: DC

## 2024-01-26 MED ORDER — OMEPRAZOLE 20 MG PO CPDR: 20.0000 mg | DELAYED_RELEASE_CAPSULE | Freq: Every day | ORAL | 3 refills | Status: DC

## 2024-01-26 NOTE — Progress Notes (Signed)
 Subjective  CC:  Chief Complaint  Patient presents with   Cough    Developed a persistent cough that's lasted more than a month.    HPI: Haley Davis is a 68 y.o. female who presents to the office today to address the problems listed above in the chief complaint. Discussed the use of AI scribe software for clinical note transcription with the patient, who gave verbal consent to proceed.  History of Present Illness Haley Davis is a 68 year old female who presents with a persistent cough lasting one month.  Cough - Persistent cough for one month, initially mild and progressively worsening - Occurs throughout the day and night - Cough is exhausting and disrupts sleep - No congestion, sneezing, post-nasal drip, fever, or sore throat - No significant mucus production - No improvement in symptoms over the past month  Upper respiratory symptoms - Runny nose present, attributed to usual allergies - No congestion or sneezing  Medication history and response - History of cough with lisinopril, prescribed in 2014 for blood pressure control after massive epistaxis - Currently on Benicar  for hypertension without similar cough issues - Uses Tussin for cough relief, provides approximately four hours of relief but not sustainable for continuous use  Gastrointestinal symptoms - No symptoms of gastroesophageal reflux disease - Pressure in chest only when coughing excessively - No heartburn or stomach issues  HLD tolerating lipitor and due for recheck.  HTN: typically well controlled. High today due to hacking cough   Assessment  1. Persistent cough for 3 weeks or longer   2. Mixed hyperlipidemia   3. Essential hypertension   4. ACE-inhibitor cough      Plan  Assessment and Plan Assessment & Plan Chronic cough Chronic cough for a month, possibly due to atypical bronchitis, post-inflammatory cough, allergies, or silent reflux. Recent TDAP vaccination excludes whooping cough as  likely dx. No bacterial infection evidence, but antibiotics trial considered. Cough impacts sleep. - Prescribed antibiotics for atypical bronchitis.zpak - Prescribed prednisone for 1 week to reduce inflammation. - Initiated Prilosec for 4-6 weeks for silent reflux. - Recommended OTC Zyrtec for allergies. - Monitor treatment response, follow up if symptoms persist. - Consider chest x-ray if no improvement.  Hyperlipidemia Stable on current cholesterol medication. lipitor - Order lab tests to check cholesterol levels.  HTN: to monitor at home. Continue benicar  hct; if cough persists, consider arb cough    Follow up: June 2026 for cpe Orders Placed This Encounter  Procedures   Lipid panel   Comprehensive metabolic panel with GFR   Meds ordered this encounter  Medications   predniSONE (DELTASONE) 10 MG tablet    Sig: Take 4 tabs qd x 2 days, 3 qd x 2 days, 2 qd x 2d, 1qd x 3 days    Dispense:  21 tablet    Refill:  0   omeprazole (PRILOSEC) 20 MG capsule    Sig: Take 1 capsule (20 mg total) by mouth daily.    Dispense:  30 capsule    Refill:  3   azithromycin (ZITHROMAX) 250 MG tablet    Sig: Take 2 tabs today, then 1 tab daily for 4 days    Dispense:  1 each    Refill:  0     I reviewed the patients updated PMH, FH, and SocHx.  Patient Active Problem List   Diagnosis Date Noted   Mixed hyperlipidemia 10/04/2023    Priority: High   Current mild episode of major  depressive disorder without prior episode 01/06/2023    Priority: High   Tubular adenoma of colon 01/06/2023    Priority: High   Endometrial cancer (HCC) 10/01/2021    Priority: High   Obesity (BMI 30-39.9) 08/12/2021    Priority: High   Essential hypertension 08/17/2012    Priority: High   ACE-inhibitor cough 01/26/2024    Priority: Medium    Angle-closure glaucoma, secondary, bilateral, mild stage 10/01/2016    Priority: Medium    Epistaxis 08/17/2012    Priority: Medium    Allergic rhinitis 08/24/2012     Priority: Low   Current Meds  Medication Sig   acetaminophen (TYLENOL) 500 MG tablet Take by mouth.   aspirin EC 325 MG tablet Take 325 mg by mouth daily.   atorvastatin  (LIPITOR) 10 MG tablet Take 1 tablet (10 mg total) by mouth daily.   azithromycin (ZITHROMAX) 250 MG tablet Take 2 tabs today, then 1 tab daily for 4 days   bifidobacterium infantis (ALIGN) capsule Take by mouth.   Cholecalciferol (VITAMIN D-3 PO) Take 2,000 Units by mouth daily.   escitalopram  (LEXAPRO ) 10 MG tablet Take 1 tablet (10 mg total) by mouth daily.   Iron, Ferrous Sulfate, 325 (65 Fe) MG TABS    Multiple Vitamin (MULTIVITAMIN WITH MINERALS) TABS Take 1 tablet by mouth daily. NO IRON   olmesartan -hydrochlorothiazide (BENICAR  HCT) 40-12.5 MG tablet Take 1 tablet by mouth daily.   omeprazole (PRILOSEC) 20 MG capsule Take 1 capsule (20 mg total) by mouth daily.   predniSONE (DELTASONE) 10 MG tablet Take 4 tabs qd x 2 days, 3 qd x 2 days, 2 qd x 2d, 1qd x 3 days   Allergies: Patient is allergic to codeine, lisinopril, and wellbutrin [bupropion]. Family History: Patient family history includes Acute myelogenous leukemia in her father; Breast cancer in her maternal aunt; Cancer in her father and maternal aunt; Hyperlipidemia in her mother; Hypertension in her mother; Ovarian cancer in her maternal aunt; Stroke in her maternal grandfather; Uterine cancer in her paternal grandmother. Social History:  Patient  reports that she has never smoked. She has never used smokeless tobacco. She reports that she does not drink alcohol and does not use drugs.  Review of Systems: Constitutional: Negative for fever malaise or anorexia Cardiovascular: negative for chest pain Respiratory: negative for SOB or persistent cough Gastrointestinal: negative for abdominal pain  Objective  Vitals: BP 124/76 Comment: typical reading when not with hacking cough.  Pulse 97   Temp 98 F (36.7 C)   Ht 5' 4 (1.626 m)   Wt 229 lb (103.9  kg)   SpO2 97%   BMI 39.31 kg/m  General: no acute distress but hacking harsh cough present , A&Ox3 HEENT: PEERL, conjunctiva normal, neck is supple Cardiovascular:  RRR without murmur or gallop.  Respiratory:  Good breath sounds bilaterally, CTAB with normal respiratory effort Abd: + midepigastric ttp w/o rebound or guarding Skin:  Warm, no rashes Commons side effects, risks, benefits, and alternatives for medications and treatment plan prescribed today were discussed, and the patient expressed understanding of the given instructions. Patient is instructed to call or message via MyChart if he/she has any questions or concerns regarding our treatment plan. No barriers to understanding were identified. We discussed Red Flag symptoms and signs in detail. Patient expressed understanding regarding what to do in case of urgent or emergency type symptoms.  Medication list was reconciled, printed and provided to the patient in AVS. Patient instructions and summary information was  reviewed with the patient as documented in the AVS. This note was prepared with assistance of Dragon voice recognition software. Occasional wrong-word or sound-a-like substitutions may have occurred due to the inherent limitations of voice recognition software

## 2024-01-26 NOTE — Patient Instructions (Signed)
 Please follow up if symptoms do not improve or as needed.     VISIT SUMMARY: Today, you were seen for a persistent cough that has lasted for one month. We discussed your symptoms, possible causes, and treatment options. Additionally, we reviewed your cholesterol management.  YOUR PLAN: -CHRONIC COUGH: A chronic cough is a cough that lasts for an extended period, in this case, one month. It can be caused by various factors such as bronchitis, allergies, or silent reflux. To address this, you have been prescribed antibiotics for a possible bronchitis, prednisone for one week to reduce inflammation, and Prilosec for 4-6 weeks to treat potential silent reflux. You should also take over-the-counter Zyrtec for allergies. Please monitor your symptoms and follow up if there is no improvement. A chest x-ray may be considered if your symptoms do not improve.  -HYPERLIPIDEMIA: Hyperlipidemia is a condition where there are high levels of fats (lipids) in your blood. Your condition is stable with your current medication. We will order lab tests to check your cholesterol levels.  INSTRUCTIONS: Please take the prescribed medications as directed: antibiotics for bronchitis, prednisone for one week, Prilosec for 4-6 weeks, and OTC Zyrtec for allergies. Monitor your symptoms and follow up if there is no improvement. We will also order lab tests to check your cholesterol levels.                      Contains text generated by [employer].                                 Contains text generated by [employer].

## 2024-01-27 LAB — COMPREHENSIVE METABOLIC PANEL WITH GFR
ALT: 12 U/L (ref 0–35)
AST: 17 U/L (ref 0–37)
Albumin: 3.9 g/dL (ref 3.5–5.2)
Alkaline Phosphatase: 63 U/L (ref 39–117)
BUN: 28 mg/dL — ABNORMAL HIGH (ref 6–23)
CO2: 24 meq/L (ref 19–32)
Calcium: 9.9 mg/dL (ref 8.4–10.5)
Chloride: 105 meq/L (ref 96–112)
Creatinine, Ser: 1.06 mg/dL (ref 0.40–1.20)
GFR: 53.42 mL/min — ABNORMAL LOW (ref >60.00)
Glucose, Bld: 104 mg/dL — ABNORMAL HIGH (ref 70–99)
Potassium: 4.3 meq/L (ref 3.5–5.1)
Sodium: 138 meq/L (ref 135–145)
Total Bilirubin: 0.4 mg/dL (ref 0.2–1.2)
Total Protein: 7.3 g/dL (ref 6.0–8.3)

## 2024-01-27 LAB — LIPID PANEL
Cholesterol: 126 mg/dL (ref 0–200)
HDL: 59.4 mg/dL (ref >39.00)
LDL Cholesterol: 50 mg/dL (ref 0–99)
NonHDL: 66.2
Total CHOL/HDL Ratio: 2
Triglycerides: 82 mg/dL (ref 0.0–149.0)
VLDL: 16.4 mg/dL (ref 0.0–40.0)

## 2024-01-30 ENCOUNTER — Ambulatory Visit: Payer: Self-pay | Admitting: Family Medicine

## 2024-01-30 NOTE — Progress Notes (Signed)
 See mychart note Dear Ms. Hemmerich, Your cholesterol looks great now.  Hope your cough calms done. Sincerely, Dr. Jodie

## 2024-02-08 LAB — HM MAMMOGRAPHY

## 2024-02-09 ENCOUNTER — Encounter: Payer: Self-pay | Admitting: Family Medicine

## 2024-02-24 NOTE — Progress Notes (Signed)
 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  History of Present Illness: Haley Davis is a 68 y.o.  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  Tumor history is as follows: She began to experience abdominal discomfort and pain. 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  Review of Systems: On review of systems, Haley Davis continues to report that she is doing quite well.  She offers no gyn complaints at this time.  Specifically, she denies any vaginal bleeding or discharge.  She denies any abdominal bloating or swelling.  She denies any abdominal or pelvic pain.  She reports no difficulty with bowel or bladder activity.  She does have chronic urinary incontinence which is not new for her.  She states her appetite is good.  She has noted a cough recently and her primary care provider attributes this to possible peptic ulcer disease.  She is on an antacid for this and states that the cough has gotten better.  No imaging has been obtained at this time.  No other complaints verbalized.  Past Medical History: 1.   Hypertension 2.  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  Denies history of abnormal pap smear.  Last pap smear in 2014.  Menopause age 45.  No HRT.  Past Surgical History: 1.  Robotic hysterectomy with bilateral salpingo-oophorectomy and sentinel node biopsy in March 2023 2.  Closed reduction distal radius fracture 3.  Cataract surgery 4.  Bilateral temporomandibular joint arthroplasty  Health Maintenance:Colonoscopy/EGD in 2023.  Never had a mammogram  Family History: Maternal aunt with ovarian cancer (deceased) and another maternal aunt with breast cancer (alive).  Paternal grandmother deceased from ?uterine cancer (92 years).  Father died from acute myelogenous leukemia felt to be secondary to occupational exposure   Social History: Social History   Socioeconomic History  . Marital status: Single  . Number of children: 0  Occupational History  . Occupation: Hydrologist counselors  Tobacco Use  . Smoking status: Never    Passive exposure: Never  . Smokeless tobacco: Never  Vaping Use  . Vaping status: Never Used  Substance and Sexual Activity  . Alcohol use: No  . Drug use: No  . Sexual activity: Never    Medications: Current Home Medications  Medication Sig  acetaminophen (TYLENOL) 500 mg tablet Take one tablet (500 mg dose) by mouth every 6 (six) hours as needed for Pain.  ALIGN (ALIGN) capsule Take one capsule by mouth daily.  atorvastatin  calcium  (LIPITOR) 10 mg tablet Take one tablet (10 mg dose) by mouth daily.  cholecalciferol (VITAMIN D3) 1000 units tablet Take one tablet (1,000 Units dose) by mouth daily.  Multiple Vitamin (MULTIVITAMIN) capsule Take  one capsule by mouth daily.  omeprazole (PRILOSEC) 20 mg capsule Take one capsule (20 mg dose) by mouth daily.    Allergies: Allergies  Allergen Reactions  . Codeine Swelling  . Metoprolol Other    depression  . Bupropion Constipation  .  Lisinopril Cough     Physical Exam: BP 146/89 (BP Location: Left Upper Arm, Patient Position: Sitting)   Pulse 85   Temp 98 F (36.7 C) (Skin)   Resp 20   Ht 5' 4 (1.626 m)   Wt 231 lb (104.8 kg)   SpO2 96%   BMI 39.65 kg/m   GENERAL: In general, this is a pleasant Caucasian female in a no acute distress. PSYCHIATRIC: She is alert and oriented x 4 and appropriate throughout the entire examination with normal thought processes HEENT: Pupils are even, no thyromegaly CARDIOVASCULAR: tachycardic, no murmurs rubs or gallops PULMONARY: Clear to auscultation bilaterally, no wheezes or crackles.  Good breath sounds are noted throughout all lung fields. ABDOMEN: Her abdomen is noted to be soft, non-tender and nondistended.  No rebound or guarding is noted.  No masses are appreicated, no organomegaly is noted.  Surgical sites are well-healed and without defect.  Along the right lower abdomen is a approximately 1 to 1-1/2 cm area of skin that has changes that are suspicious for possible tinea corpus but not consistent with malignancy and are distal to her incision.  No other skin changes are appreciated. PELVIC:   External genitalia:  Normal external female genitalia. No lesions are seen grossly on the mons, or bilateral labia.  She has a normal BUS.    Perineum/Anus: No lesions seen  Urethra:Urethra appears normal, midline and without prolapse or gross lesion..  Bladder: No bladder prolapse is noted. No bladder tenderness is appreciated on  palpation.  Vagina:  On speculum examination, the vaginal mucosa is atrophic and grossly normal.  No abnormal bleeding or discharge is noted.  No lesions are identified.  Bimanual examination reveals a supple vagina without induration or nodularity.  Cervix:   Surgically absent.  Uterus: Surgically absent.  Adnexa: No adnexal masses or fullness appreciated.  No intra-abdominal disease noted.  Parametria: No parametrial thickening or nodularity  noted.  Digital Rectal Examination: Deferred  NODES:  No inguinal adenopathy, No supraclavicular adenopathy.  No axillary lymphadenopathy. MUSCULOSKELETAL/EXTREMITIES: Bilateral lower extremities symmetric, not tender, without edema.   SKIN: Warm, dry, well perfused  Labs: No results found for this or any previous visit (from the past 72 hours). No results found for this or any previous visit (from the past 72 hours). No results found for this or any previous visit (from the past 72 hours).   Radiology: No results found.    A/P:  1.  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 Based on today's evaluation, comprehensive examination and review of systems, there is no overt evidence of active or recurrent disease. Close surveillance is warranted and unless otherwise indicated, we will see her back in 6 months for continued assessment. She verbalizes an understanding of the care plan that has been outlined and all questions answered..  2.  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  Cough Suspected related to silent GERD Recommend that if she continues to have persistence of the cough, that imaging be obtained to exclude possible other pulmonary diseases     Almarie LOISE Barrio, MD 02/24/2024 4:02 PM  This note was dictated using voice recognition software. Similar sounding words can inadvertently get transcribed and not corrected upon review.

## 2024-02-28 ENCOUNTER — Telehealth: Payer: Self-pay

## 2024-02-28 ENCOUNTER — Other Ambulatory Visit: Payer: Self-pay

## 2024-02-28 ENCOUNTER — Other Ambulatory Visit: Payer: Self-pay | Admitting: Family Medicine

## 2024-02-28 MED ORDER — OLMESARTAN MEDOXOMIL-HCTZ 40-12.5 MG PO TABS: 1.0000 | ORAL_TABLET | Freq: Every day | ORAL | 3 refills | Status: DC

## 2024-02-28 NOTE — Telephone Encounter (Signed)
 Copied from CRM [id number]. Topic: Clinical - Medication Refill >> Feb 28, 2024 12:59 PM Frederich PARAS wrote: Medication: olmesartan -hydrochlorothiazide (BENICAR  HCT) 40-12.5 MG tablet  Has the patient contacted their pharmacy? Yes PPHARMACY SAYS THEY DID NOT GET IT , KAYLA FROM [facility] ON LINE   This is the patient's preferred pharmacy:  [id number] [place], [place] - [address] AT [facility] [address] [facility] [address] [facility] [address] [place] [address] Phone: [phone] Fax: [phone]  Is this the correct pharmacy for this prescription? Yes If no, delete pharmacy and type the correct one.   Has the prescription been filled recently? Yes  Is the patient out of the medication? Yes For a week   Has the patient been seen for an appointment in the last year OR does the patient have an upcoming appointment? Yes  Can we respond through MyChart? Yes  Agent: Please be advised that Rx refills may take up to 3 business days. We ask that you follow-up with your pharmacy.

## 2024-02-28 NOTE — Telephone Encounter (Signed)
 Copied from CRM [id number]. Topic: Clinical - Prescription Issue >> Feb 28, 2024 11:13 AM Macario HERO wrote: Reason for CRM: Patient called and said that [facility] said they never received prescription for olmesartan -hydrochlorothiazide (BENICAR  HCT) 40-12.5 MG tablet [[id number]] and she is currently out of her medication.

## 2024-03-15 ENCOUNTER — Other Ambulatory Visit: Payer: Self-pay

## 2024-03-15 ENCOUNTER — Ambulatory Visit (INDEPENDENT_AMBULATORY_CARE_PROVIDER_SITE_OTHER): Payer: Medicare (Managed Care)

## 2024-03-15 VITALS — BP 117/72 | HR 93 | Ht 64.0 in | Wt 229.0 lb

## 2024-03-15 DIAGNOSIS — Z860100 Personal history of colon polyps, unspecified: Secondary | ICD-10-CM | POA: Diagnosis not present

## 2024-03-15 DIAGNOSIS — Z0000 Encounter for general adult medical examination without abnormal findings: Secondary | ICD-10-CM | POA: Diagnosis not present

## 2024-03-15 DIAGNOSIS — Z1211 Encounter for screening for malignant neoplasm of colon: Secondary | ICD-10-CM

## 2024-03-15 MED ORDER — OLMESARTAN MEDOXOMIL-HCTZ 40-12.5 MG PO TABS: 1.0000 | ORAL_TABLET | Freq: Every day | ORAL | 3 refills | Status: AC

## 2024-03-15 MED ORDER — ATORVASTATIN CALCIUM 10 MG PO TABS
10.0000 mg | ORAL_TABLET | Freq: Every day | ORAL | 3 refills | Status: AC
Start: 2024-03-15 — End: ?

## 2024-03-15 NOTE — Progress Notes (Signed)
 Chief Complaint  Patient presents with   Medicare Wellness     Subjective:   Haley Davis is a 68 y.o. female who presents for a Medicare Annual Wellness Visit.  Allergies (verified) Codeine, Lisinopril, and Wellbutrin [bupropion]   History: Past Medical History:  Diagnosis Date   Allergy    Arthritis    Cancer (HCC)    Cataract 06/2016   Removed in 2018   Depression    Environmental allergies    GERD (gastroesophageal reflux disease)    History of radiation therapy    Endometrium- 10/09/21-11/06/21- [facility]   Hypertension    Past Surgical History:  Procedure Laterality Date    right arm surgery Right    ABDOMINAL HYSTERECTOMY     BACK SURGERY     EYE SURGERY  07/2016   FRACTURE SURGERY  1971   MANDIBLE FRACTURE SURGERY     Family History  Problem Relation Age of Onset   Hyperlipidemia Mother    Hypertension Mother    Arthritis Mother    Heart disease Mother    Cancer Father    Acute myelogenous leukemia Father    Arthritis Father    Early death Father    Cancer Maternal Aunt    Ovarian cancer Maternal Aunt    Breast cancer Maternal Aunt    Stroke Maternal Grandfather    Heart disease Maternal Grandfather    Uterine cancer Paternal Grandmother    Arthritis Paternal Grandmother    Heart disease Paternal Grandmother    Miscarriages / Stillbirths Paternal Grandmother    Obesity Paternal Grandmother    Arthritis Maternal Grandmother    Hearing loss Paternal Grandfather    Cancer Maternal Aunt    Hearing loss Paternal Aunt    Obesity Paternal Aunt    Hearing loss Brother    Heart disease Brother    Social History   Occupational History   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    Employer: [employer]  Tobacco Use   Smoking status: Never   Smokeless tobacco: Never  Vaping Use   Vaping status: Never Used  Substance and Sexual Activity   Alcohol use: Not Currently   Drug use: Never   Sexual activity: Not Currently   Tobacco  Counseling Counseling given: Not Answered  SDOH Screenings   Food Insecurity: No Food Insecurity (03/14/2024)  Housing: Low Risk  (03/14/2024)  Transportation Needs: No Transportation Needs (03/14/2024)  Utilities: Not At Risk (03/15/2024)  Depression (PHQ2-9): Low Risk  (03/15/2024)  Financial Resource Strain: Low Risk  (03/14/2024)  Physical Activity: Sufficiently Active (03/14/2024)  Social Connections: Moderately Isolated (03/14/2024)  Stress: No Stress Concern Present (03/14/2024)  Tobacco Use: Low Risk  (03/15/2024)  Health Literacy: Adequate Health Literacy (03/15/2024)   See flowsheets for full screening details  Depression Screen PHQ 2 & 9 Depression Scale- Over the past 2 weeks, how often have you been bothered by any of the following problems? Little interest or pleasure in doing things: 0 Feeling down, depressed, or hopeless (PHQ Adolescent also includes...irritable): 0 PHQ-2 Total Score: 0     Goals Addressed               This Visit's Progress     lose weight (pt-stated)        Weight loss        Visit info / Clinical Intake: Medicare Wellness Visit Type:: Subsequent Annual Wellness Visit Persons participating in visit:: patient Medicare Wellness Visit Mode:: Telephone If telephone:: video declined Because  this visit was a virtual/telehealth visit:: pt reported vitals If Telephone or Video please confirm:: I discussed the limitations of evaluation and management by telemedicine; The patient expressed understanding and agreed to proceed; I connected with the patient using audio enabled telemedicine application and verified that I am speaking with the correct person using two identifiers Patient Location:: home Provider Location:: home office Information given by:: patient Interpreter Needed?: No Pre-visit prep was completed: yes AWV questionnaire completed by patient prior to visit?: yes Date:: 03/14/24 Living arrangements:: (!) lives  alone Patient's Overall Health Status Rating: good Typical amount of pain: none Does pain affect daily life?: no Are you currently prescribed opioids?: no  Dietary Habits and Nutritional Risks How many meals a day?: 3 Eats fruit and vegetables daily?: yes Most meals are obtained by: preparing own meals In the last 2 weeks, have you had any of the following?: none Diabetic:: no  Functional Status Activities of Daily Living (to include ambulation/medication): (Patient-Rptd) Independent Ambulation: Independent with device- listed below Home Assistive Devices/Equipment: Eyeglasses Medication Administration: Independent Home Management: (Patient-Rptd) Independent Manage your own finances?: yes Primary transportation is: driving Concerns about vision?: no *vision screening is required for WTM* Concerns about hearing?: (!) yes (noise area hoh) Uses hearing aids?: no Hear whispered voice?: yes  Fall Screening Falls in the past year?: (Patient-Rptd) 0 Number of falls in past year: 0 Was there an injury with Fall?: 0 Fall Risk Category Calculator: 0 Patient Fall Risk Level: Low Fall Risk  Fall Risk Patient at Risk for Falls Due to: No Fall Risks Fall risk Follow up: Falls prevention discussed  Home and Transportation Safety: All rugs have non-skid backing?: yes All stairs or steps have railings?: yes Grab bars in the bathtub or shower?: (!) no Have non-skid surface in bathtub or shower?: yes Good home lighting?: yes Regular seat belt use?: yes  Cognitive Assessment Difficulty concentrating, remembering, or making decisions? : no Will 6CIT or Mini Cog be Completed: no 6CIT or Mini Cog Declined: patient alert, oriented, able to answer questions appropriately and recall recent events  Advance Directives (For Healthcare) Does Patient Have a Medical Advance Directive?: Yes Type of Advance Directive: Healthcare Power of Attorney Copy of Healthcare Power of Attorney in Chart?: Yes  - validated most recent copy scanned in chart (See row information)  Reviewed/Updated  Reviewed/Updated: Reviewed All (Medical, Surgical, Family, Medications, Allergies, Care Teams, Patient Goals)        Objective:    Today's Vitals   03/15/24 1350  BP: 117/72  Pulse: 93  Weight: 229 lb (103.9 kg)  Height: 5' 4 (1.626 m)   Body mass index is 39.31 kg/m.  Current Medications (verified) Outpatient Encounter Medications as of 03/15/2024  Medication Sig   acetaminophen (TYLENOL) 500 MG tablet Take by mouth.   aspirin EC 325 MG tablet Take 325 mg by mouth daily.   atorvastatin  (LIPITOR) 10 MG tablet Take 1 tablet (10 mg total) by mouth daily.   bifidobacterium infantis (ALIGN) capsule Take by mouth.   Cholecalciferol (VITAMIN D-3 PO) Take 2,000 Units by mouth daily.   Iron, Ferrous Sulfate, 325 (65 Fe) MG TABS    Multiple Vitamin (MULTIVITAMIN WITH MINERALS) TABS Take 1 tablet by mouth daily. NO IRON   olmesartan -hydrochlorothiazide (BENICAR  HCT) 40-12.5 MG tablet Take 1 tablet by mouth daily.   omeprazole  (PRILOSEC) 20 MG capsule Take 1 capsule (20 mg total) by mouth daily.   [DISCONTINUED] azithromycin  (ZITHROMAX ) 250 MG tablet Take 2 tabs today, then 1  tab daily for 4 days   [DISCONTINUED] escitalopram  (LEXAPRO ) 10 MG tablet Take 1 tablet (10 mg total) by mouth daily.   [DISCONTINUED] predniSONE (DELTASONE) 10 MG tablet Take 4 tabs qd x 2 days, 3 qd x 2 days, 2 qd x 2d, 1qd x 3 days   No facility-administered encounter medications on file as of 03/15/2024.   Hearing/Vision screen Hearing Screening - Comments:: Pt HOH in noisy area  Vision Screening - Comments:: Wears rx glasses - up to date with routine eye exams with Dr Octavia  Immunizations and Health Maintenance Health Maintenance  Topic Date Due   COVID-19 Vaccine [phone] - 2025-26 season) 03/07/2024   Colonoscopy  05/28/2024   Bone Density Scan  12/09/2024   Mammogram  02/07/2025   Medicare Annual Wellness (AWV)   03/15/2025   DTaP/Tdap/Td (4 - Td or Tdap) 09/15/2032   Pneumococcal Vaccine: 50+ Years  Completed   Influenza Vaccine  Completed   Hepatitis C Screening  Completed   Zoster Vaccines- Shingrix  Completed   Meningococcal B Vaccine  Aged Out        Assessment/Plan:  This is a routine wellness examination for Armie.  Patient Care Team: Jodie Lavern CROME, MD as PCP - General (Family Medicine) Arlee, Almarie SAILOR, MD as Consulting Physician (Gynecologic Oncology) Caver, Calton MATSU, NP as Nurse Practitioner (Dermatology) Jefrey Bruckner, MD as Consulting Physician (Gastroenterology)  I have personally reviewed and noted the following in the patient's chart:   Medical and social history Use of alcohol, tobacco or illicit drugs  Current medications and supplements including opioid prescriptions. Functional ability and status Nutritional status Physical activity Advanced directives List of other physicians Hospitalizations, surgeries, and ER visits in previous 12 months Vitals Screenings to include cognitive, depression, and falls Referrals and appointments  Orders Placed This Encounter  Procedures   Ambulatory referral to gastroenterology for colonoscopy    Referral Priority:   Routine    Referral Type:   Consultation    Referral Reason:   Specialty Services Required    Number of Visits Requested:   1   In addition, I have reviewed and discussed with patient certain preventive protocols, quality metrics, and best practice recommendations. A written personalized care plan for preventive services as well as general preventive health recommendations were provided to patient.   Ellouise VEAR Haws, LPN   88/80/7974   Return in 1 year (on 03/27/2025) for AWV @ 1:40 pm .  After Visit Summary: (MyChart) Due to this being a telephonic visit, the after visit summary with patients personalized plan was offered to patient via MyChart   Nurse Notes: none

## 2024-03-15 NOTE — Patient Instructions (Signed)
 Haley Davis,  Thank you for taking the time for your Medicare Wellness Visit. I appreciate your continued commitment to your health goals. Please review the care plan we discussed, and feel free to reach out if I can assist you further.  Please note that Annual Wellness Visits do not include a physical exam. Some assessments may be limited, especially if the visit was conducted virtually. If needed, we may recommend an in-person follow-up with your provider.  Ongoing Care Seeing your primary care provider every 3 to 6 months helps us  monitor your health and provide consistent, personalized care.   Referrals If a referral was made during today's visit and you haven't received any updates within two weeks, please contact the referred provider directly to check on the status.  Recommended Screenings:  Health Maintenance  Topic Date Due   COVID-19 Vaccine (11 - 2025-26 season) 03/07/2024   Medicare Annual Wellness Visit  03/09/2024   Colon Cancer Screening  05/28/2024   Osteoporosis screening with Bone Density Scan  12/09/2024   Breast Cancer Screening  02/07/2025   DTaP/Tdap/Td vaccine (4 - Td or Tdap) 09/15/2032   Pneumococcal Vaccine for age over 73  Completed   Flu Shot  Completed   Hepatitis C Screening  Completed   Zoster (Shingles) Vaccine  Completed   Meningitis B Vaccine  Aged Out       03/14/2024    5:04 PM  Advanced Directives  Does Patient Have a Medical Advance Directive? Yes  Type of Advance Directive Healthcare Power of Attorney  Copy of Healthcare Power of Attorney in Chart? Yes - validated most recent copy scanned in chart (See row information)    Vision: Annual vision screenings are recommended for early detection of glaucoma, cataracts, and diabetic retinopathy. These exams can also reveal signs of chronic conditions such as diabetes and high blood pressure.  Dental: Annual dental screenings help detect early signs of oral cancer, gum disease, and other conditions  linked to overall health, including heart disease and diabetes.  Please see the attached documents for additional preventive care recommendations.

## 2024-03-30 ENCOUNTER — Ambulatory Visit: Payer: Medicare (Managed Care) | Admitting: Family Medicine

## 2024-03-30 ENCOUNTER — Encounter: Payer: Self-pay | Admitting: Family Medicine

## 2024-03-30 VITALS — BP 120/72 | HR 86 | Temp 97.7°F | Ht 64.0 in | Wt 232.6 lb

## 2024-03-30 DIAGNOSIS — I10 Essential (primary) hypertension: Secondary | ICD-10-CM | POA: Diagnosis not present

## 2024-03-30 DIAGNOSIS — K219 Gastro-esophageal reflux disease without esophagitis: Secondary | ICD-10-CM | POA: Insufficient documentation

## 2024-03-30 DIAGNOSIS — E669 Obesity, unspecified: Secondary | ICD-10-CM

## 2024-03-30 DIAGNOSIS — F320 Major depressive disorder, single episode, mild: Secondary | ICD-10-CM

## 2024-03-30 MED ORDER — OMEPRAZOLE 20 MG PO CPDR: 20.0000 mg | DELAYED_RELEASE_CAPSULE | Freq: Every day | ORAL | 3 refills | Status: AC

## 2024-03-30 NOTE — Patient Instructions (Signed)
 Please return in 6 months for your annual complete physical; please come fasting.  For follow up on chronic medical conditions   If you have any questions or concerns, please don't hesitate to send me a message via MyChart or call the office at [phone]. Thank you for visiting with us  today! It's our pleasure caring for you.   VISIT SUMMARY: During today's visit, we reviewed your hypertension, mood stabilization, history of malignancy, GERD symptoms, and weight management concerns. Your home blood pressure readings are within the normal range, and you are feeling emotionally stable after discontinuing your mood-stabilizing medication. Your cholesterol levels have improved, and your GERD symptoms are well-managed with Prilosec. We also discussed your weight management concerns and potential treatment options.  YOUR PLAN: -HYPERTENSION: Hypertension, or high blood pressure, is when the force of the blood against your artery walls is too high. Your home blood pressure readings are within the normal range, so we will continue with your current management plan.  -DEPRESSION, RESOLVED: Your depression, which began after your mother's death, has resolved. You have successfully weaned off your medication and are feeling emotionally stable.  -GASTROESOPHAGEAL REFLUX DISEASE (GERD): GERD is a condition where stomach acid frequently flows back into the tube connecting your mouth and stomach. Your symptoms are currently managed with Prilosec. Continue taking Prilosec until after the holidays, then try discontinuing it for four weeks to see if symptoms return.  -HYPERLIPIDEMIA: Hyperlipidemia is having high levels of fats (lipids) in your blood, such as cholesterol. Your cholesterol levels have improved significantly with your current medication. Continue with your current medication regimen.  -HISTORY OF CANCER: You have a history of cancer, but your regular follow-ups with your oncologist show no current  issues. Continue with your regular follow-ups.  -GENERAL HEALTH MAINTENANCE: We discussed weight management and the potential use of GLP-1 agonists, but the cost is a barrier. Continue with a healthy diet and lifestyle modifications.  INSTRUCTIONS: We will recheck your kidney function in six months during your physical examination unless you develop symptoms. Continue with your regular follow-ups with your oncologist.                      Contains text generated by [employer]bridge.                                 Contains text generated by Abridge.

## 2024-03-30 NOTE — Progress Notes (Signed)
 Subjective  CC:  Chief Complaint  Patient presents with   Hypertension   Depression    HPI: Haley Davis is a 68 y.o. female who presents to the office today to address the problems listed above in the chief complaint. Hypertension f/u:  Discussed the use of AI scribe software for clinical note transcription with the patient, who gave verbal consent to proceed.  History of Present Illness Haley Davis is a 68 year old female who presents for follow-up of hypertension and mood stabilization.  Hypertension - Home blood pressure readings with Om[employer] device consistently lower than clinic measurements. - This morning's blood pressure: 116/72 mmHg. - Highest blood pressure this week: 126/78 mmHg. Does well on medication. Feeling well. Taking medications w/o adverse effects. No symptoms of CHF, angina; no palpitations, sob, cp or lower extremity edema. Compliant with meds.    Mood stabilization and emotional health - Recently discontinued mood-stabilizing medication, lexapro . previously taken since her mother's death over three years ago. - Feels emotionally stable. - Improved mood attributed to positive oncologist visits. No longer down over h/o endometrial cancer.   History of malignancy - History of cancer, currently in remission. - Cholesterol levels have decreased by fifty points since last check-up.  Gastroesophageal reflux disease symptoms; see last visit for persistent cough. Now resolved. Etiology: silent reflux.  - Cough and constant rhinorrhea present. - Symptoms managed with Prilosec. Resolved with prilosec 20  Weight management concerns - Difficulty with weight management. - Previous weight loss due to pain and vomiting associated with tumor. - Concerned about metabolic nature of weight issues and financial burden of potential treatments.   Assessment  1. Essential hypertension   2. Gastroesophageal reflux disease without esophagitis   3. Current mild episode of  major depressive disorder without prior episode   4. Obesity (BMI 30-39.9)      Plan  Assessment and Plan Assessment & Plan Hypertension Blood pressure readings at home are within normal range (116/72 mmHg, highest 126/78 mmHg). Elevated readings in the clinic likely due to white coat syndrome. - Continue current management as home readings are satisfactory. Benicar  hct.  Depression, resolved. Treated. Lexapro  stopped.  Depression resolved after three years since mother's death. Successfully weaned off medication and reports emotional stability.  Gastroesophageal reflux disease (GERD) GERD symptoms, including cough and runny nose, resolved with Prilosec. - Continue Prilosec until after the holidays, then attempt to discontinue for four weeks to assess symptom recurrence.   Hyperlipidemia controlled on lipitor Cholesterol levels have improved significantly with current medication, with a 50-point drop in cholesterol levels. - Continue current medication regimen.  History of cancer Regular follow-ups with oncologist show no current issues. - Continue regular follow-ups with oncologist.  General Health Maintenance Discussed weight management and potential use of GLP-1 agonists, but cost is a barrier. Encouraged healthy eating and lifestyle. - Encouraged healthy diet and lifestyle modifications.     Education regarding management of these chronic disease states was given. Management strategies discussed on successive visits include dietary and exercise recommendations, goals of achieving and maintaining IBW, and lifestyle modifications aiming for adequate sleep and minimizing stressors.  Follow up: 6 mo for cpe and f/u  No orders of the defined types were placed in this encounter.  Meds ordered this encounter  Medications   omeprazole  (PRILOSEC) 20 MG capsule    Sig: Take 1 capsule (20 mg total) by mouth daily.    Dispense:  90 capsule    Refill:  3  BP Readings from  Last 3 Encounters:  03/30/24 120/72  03/15/24 117/72  01/26/24 124/76   Wt Readings from Last 3 Encounters:  03/30/24 232 lb 9.6 oz (105.5 kg)  03/15/24 229 lb (103.9 kg)  01/26/24 229 lb (103.9 kg)    Lab Results  Component Value Date   CHOL 126 01/26/2024   CHOL 189 09/29/2023   CHOL 193 01/06/2023   Lab Results  Component Value Date   HDL 59.40 01/26/2024   HDL 62.10 09/29/2023   HDL 82.10 01/06/2023   Lab Results  Component Value Date   LDLCALC 50 01/26/2024   LDLCALC 100 (H) 09/29/2023   LDLCALC 94 01/06/2023   Lab Results  Component Value Date   TRIG 82.0 01/26/2024   TRIG 134.0 09/29/2023   TRIG 85.0 01/06/2023   Lab Results  Component Value Date   CHOLHDL 2 01/26/2024   CHOLHDL 3 09/29/2023   CHOLHDL 2 01/06/2023   No results found for: LDLDIRECT Lab Results  Component Value Date   CREATININE 1.06 01/26/2024   BUN 28 (H) 01/26/2024   NA 138 01/26/2024   K 4.3 01/26/2024   CL 105 01/26/2024   CO2 24 01/26/2024    The ASCVD Risk score (Arnett DK, et al., 2019) failed to calculate for the following reasons:   The valid total cholesterol range is 130 to 320 mg/dL  I reviewed the patients updated PMH, FH, and SocHx.    Patient Active Problem List   Diagnosis Date Noted   Mixed hyperlipidemia 10/04/2023    Priority: High   Tubular adenoma of colon 01/06/2023    Priority: High   History of endometrial cancer 10/01/2021    Priority: High   Obesity (BMI 30-39.9) 08/12/2021    Priority: High   Essential hypertension 08/17/2012    Priority: High   Gastroesophageal reflux, silent, chronic cough 03/30/2024    Priority: Medium    ACE-inhibitor cough 01/26/2024    Priority: Medium    Angle-closure glaucoma, secondary, bilateral, mild stage 10/01/2016    Priority: Medium    Epistaxis 08/17/2012    Priority: Medium    Allergic rhinitis 08/24/2012    Priority: Low    Allergies: Codeine, Lisinopril, and Wellbutrin [bupropion]  Social  History: Patient  reports that she has never smoked. She has never used smokeless tobacco. She reports that she does not currently use alcohol. She reports that she does not use drugs.  Current Meds  Medication Sig   acetaminophen (TYLENOL) 500 MG tablet Take by mouth.   aspirin EC 325 MG tablet Take 325 mg by mouth daily.   atorvastatin  (LIPITOR) 10 MG tablet Take 1 tablet (10 mg total) by mouth daily.   bifidobacterium infantis (ALIGN) capsule Take by mouth.   Cholecalciferol (VITAMIN D-3 PO) Take 2,000 Units by mouth daily.   Iron, Ferrous Sulfate, 325 (65 Fe) MG TABS    Multiple Vitamin (MULTIVITAMIN WITH MINERALS) TABS Take 1 tablet by mouth daily. NO IRON   olmesartan -hydrochlorothiazide (BENICAR  HCT) 40-12.5 MG tablet Take 1 tablet by mouth daily.   [DISCONTINUED] omeprazole  (PRILOSEC) 20 MG capsule Take 1 capsule (20 mg total) by mouth daily.    Review of Systems: Cardiovascular: negative for chest pain, palpitations, leg swelling, orthopnea Respiratory: negative for SOB, wheezing or persistent cough Gastrointestinal: negative for abdominal pain Genitourinary: negative for dysuria or gross hematuria  Objective  Vitals: BP 120/72 Comment: this am and by home readings consistently  Pulse 86   Temp 97.7 F (36.5 C)  Ht 5' 4 (1.626 m)   Wt 232 lb 9.6 oz (105.5 kg)   SpO2 99%   BMI 39.93 kg/m  General: no acute distress  Psych:  Alert and oriented, normal mood and affect HEENT:  Normocephalic, atraumatic, supple neck  Cardiovascular:  RRR without murmur. no edema Respiratory:  Good breath sounds bilaterally, CTAB with normal respiratory effort Neurologic:   Mental status is normal Commons side effects, risks, benefits, and alternatives for medications and treatment plan prescribed today were discussed, and the patient expressed understanding of the given instructions. Patient is instructed to call or message via MyChart if he/she has any questions or concerns regarding our  treatment plan. No barriers to understanding were identified. We discussed Red Flag symptoms and signs in detail. Patient expressed understanding regarding what to do in case of urgent or emergency type symptoms.  Medication list was reconciled, printed and provided to the patient in AVS. Patient instructions and summary information was reviewed with the patient as documented in the AVS. This note was prepared with assistance of Dragon voice recognition software. Occasional wrong-word or sound-a-like substitutions may have occurred due to the inherent limitation

## 2024-09-28 ENCOUNTER — Ambulatory Visit: Payer: Medicare (Managed Care) | Admitting: Family Medicine

## 2025-03-27 ENCOUNTER — Ambulatory Visit: Payer: Medicare (Managed Care)
